# Patient Record
Sex: Female | Born: 1970 | Race: Asian | Hispanic: No | Marital: Married | Smoking: Never smoker
Health system: Southern US, Community
[De-identification: ages and names within clinical notes are randomized; demographics above are authoritative.]

---

## 2009-06-20 ENCOUNTER — Emergency Department (HOSPITAL_COMMUNITY): Admission: EM | Admit: 2009-06-20 | Discharge: 2009-06-20 | Payer: Self-pay | Admitting: Family Medicine

## 2010-07-15 ENCOUNTER — Emergency Department (HOSPITAL_COMMUNITY): Admission: EM | Admit: 2010-07-15 | Discharge: 2010-07-15 | Payer: Self-pay | Admitting: Emergency Medicine

## 2011-03-24 ENCOUNTER — Other Ambulatory Visit: Payer: Self-pay | Admitting: Obstetrics and Gynecology

## 2011-03-24 DIAGNOSIS — Z331 Pregnant state, incidental: Secondary | ICD-10-CM

## 2011-03-24 DIAGNOSIS — O09529 Supervision of elderly multigravida, unspecified trimester: Secondary | ICD-10-CM

## 2011-03-24 DIAGNOSIS — Z3689 Encounter for other specified antenatal screening: Secondary | ICD-10-CM

## 2011-03-24 DIAGNOSIS — O0930 Supervision of pregnancy with insufficient antenatal care, unspecified trimester: Secondary | ICD-10-CM

## 2011-03-24 LAB — HEPATITIS B SURFACE ANTIGEN: Hepatitis B Surface Ag: NEGATIVE

## 2011-03-24 LAB — CBC: Platelets: 234 10*3/uL (ref 150–399)

## 2011-03-24 LAB — RUBELLA ANTIBODY, IGM: Rubella: IMMUNE

## 2011-03-24 LAB — RPR: RPR: NONREACTIVE

## 2011-03-24 LAB — ABO/RH: RH Type: POSITIVE

## 2011-03-25 LAB — TYPE AND SCREEN: Antibody Screen: NEGATIVE

## 2011-03-25 LAB — POCT URINALYSIS DIP (DEVICE)
Bilirubin Urine: NEGATIVE
Glucose, UA: NEGATIVE mg/dL
Hgb urine dipstick: NEGATIVE
Ketones, ur: NEGATIVE mg/dL
Specific Gravity, Urine: 1.015 (ref 1.005–1.030)
Urobilinogen, UA: 0.2 mg/dL (ref 0.0–1.0)

## 2011-03-29 ENCOUNTER — Ambulatory Visit (HOSPITAL_COMMUNITY)
Admission: RE | Admit: 2011-03-29 | Discharge: 2011-03-29 | Disposition: A | Discharge status: Home / Self Care | Payer: Medicaid Other | Source: Ambulatory Visit | Attending: Obstetrics and Gynecology | Admitting: Obstetrics and Gynecology

## 2011-03-29 DIAGNOSIS — Z363 Encounter for antenatal screening for malformations: Secondary | ICD-10-CM | POA: Insufficient documentation

## 2011-03-29 DIAGNOSIS — Z1389 Encounter for screening for other disorder: Secondary | ICD-10-CM | POA: Insufficient documentation

## 2011-03-29 DIAGNOSIS — O09529 Supervision of elderly multigravida, unspecified trimester: Secondary | ICD-10-CM | POA: Insufficient documentation

## 2011-03-29 DIAGNOSIS — O0930 Supervision of pregnancy with insufficient antenatal care, unspecified trimester: Secondary | ICD-10-CM | POA: Insufficient documentation

## 2011-03-29 DIAGNOSIS — O358XX0 Maternal care for other (suspected) fetal abnormality and damage, not applicable or unspecified: Secondary | ICD-10-CM | POA: Insufficient documentation

## 2011-03-29 DIAGNOSIS — Z3689 Encounter for other specified antenatal screening: Secondary | ICD-10-CM

## 2011-04-21 ENCOUNTER — Other Ambulatory Visit: Payer: Self-pay | Admitting: Obstetrics and Gynecology

## 2011-04-21 DIAGNOSIS — O0930 Supervision of pregnancy with insufficient antenatal care, unspecified trimester: Secondary | ICD-10-CM

## 2011-04-21 LAB — POCT URINALYSIS DIP (DEVICE)
Bilirubin Urine: NEGATIVE
Glucose, UA: NEGATIVE mg/dL
Hgb urine dipstick: NEGATIVE
Ketones, ur: NEGATIVE mg/dL
Nitrite: NEGATIVE
Specific Gravity, Urine: 1.015 (ref 1.005–1.030)

## 2011-04-28 ENCOUNTER — Other Ambulatory Visit: Payer: Self-pay | Admitting: Family

## 2011-04-28 ENCOUNTER — Other Ambulatory Visit: Payer: Self-pay | Admitting: Family Medicine

## 2011-04-28 DIAGNOSIS — O09529 Supervision of elderly multigravida, unspecified trimester: Secondary | ICD-10-CM

## 2011-04-28 DIAGNOSIS — O0930 Supervision of pregnancy with insufficient antenatal care, unspecified trimester: Secondary | ICD-10-CM

## 2011-04-28 LAB — POCT URINALYSIS DIP (DEVICE)
Ketones, ur: NEGATIVE mg/dL
Leukocytes, UA: NEGATIVE
Specific Gravity, Urine: 1.015 (ref 1.005–1.030)
pH: 8.5 — ABNORMAL HIGH (ref 5.0–8.0)

## 2011-05-03 ENCOUNTER — Ambulatory Visit (HOSPITAL_COMMUNITY)
Admission: RE | Admit: 2011-05-03 | Discharge: 2011-05-03 | Disposition: A | Discharge status: Home / Self Care | Payer: Medicaid Other | Source: Ambulatory Visit | Attending: Family Medicine | Admitting: Family Medicine

## 2011-05-03 DIAGNOSIS — Z3689 Encounter for other specified antenatal screening: Secondary | ICD-10-CM | POA: Insufficient documentation

## 2011-05-03 DIAGNOSIS — O09529 Supervision of elderly multigravida, unspecified trimester: Secondary | ICD-10-CM | POA: Insufficient documentation

## 2011-05-03 DIAGNOSIS — O365990 Maternal care for other known or suspected poor fetal growth, unspecified trimester, not applicable or unspecified: Secondary | ICD-10-CM | POA: Insufficient documentation

## 2011-05-26 ENCOUNTER — Other Ambulatory Visit: Payer: Self-pay | Admitting: Obstetrics and Gynecology

## 2011-05-26 DIAGNOSIS — O09529 Supervision of elderly multigravida, unspecified trimester: Secondary | ICD-10-CM

## 2011-05-26 DIAGNOSIS — O0930 Supervision of pregnancy with insufficient antenatal care, unspecified trimester: Secondary | ICD-10-CM

## 2011-05-28 LAB — STREP B DNA PROBE: GBSP: NEGATIVE

## 2011-06-08 ENCOUNTER — Inpatient Hospital Stay (HOSPITAL_COMMUNITY)
Admission: AD | Admit: 2011-06-08 | Discharge: 2011-06-11 | DRG: 767 | Disposition: A | Discharge status: Home / Self Care | Payer: Medicaid Other | Source: Ambulatory Visit | Attending: Obstetrics & Gynecology | Admitting: Obstetrics & Gynecology

## 2011-06-08 ENCOUNTER — Encounter (HOSPITAL_COMMUNITY): Payer: Self-pay | Admitting: *Deleted

## 2011-06-08 DIAGNOSIS — O09529 Supervision of elderly multigravida, unspecified trimester: Principal | ICD-10-CM | POA: Diagnosis present

## 2011-06-08 DIAGNOSIS — Z302 Encounter for sterilization: Secondary | ICD-10-CM

## 2011-06-08 LAB — RPR: RPR Ser Ql: NONREACTIVE

## 2011-06-08 LAB — CBC
Hemoglobin: 12.4 g/dL (ref 12.0–15.0)
MCH: 27.6 pg (ref 26.0–34.0)
MCHC: 33.6 g/dL (ref 30.0–36.0)
Platelets: 203 10*3/uL (ref 150–400)
RDW: 13 % (ref 11.5–15.5)

## 2011-06-08 MED ORDER — ACETAMINOPHEN 325 MG PO TABS: 650.0000 mg | ORAL_TABLET | ORAL | Status: DC | PRN

## 2011-06-08 MED ORDER — BENZOCAINE-MENTHOL 20-0.5 % EX AERO: 1.0000 "application " | INHALATION_SPRAY | CUTANEOUS | Status: DC | PRN

## 2011-06-08 MED ORDER — DIPHENHYDRAMINE HCL 25 MG PO CAPS: 25.0000 mg | ORAL_CAPSULE | Freq: Four times a day (QID) | ORAL | Status: DC | PRN

## 2011-06-08 MED ORDER — IBUPROFEN 600 MG PO TABS
600.0000 mg | ORAL_TABLET | Freq: Four times a day (QID) | ORAL | Status: DC | PRN
  Administered 2011-06-08: 600 mg via ORAL
  Filled 2011-06-08: qty 1

## 2011-06-08 MED ORDER — TETANUS-DIPHTH-ACELL PERTUSSIS 5-2.5-18.5 LF-MCG/0.5 IM SUSP
0.5000 mL | Freq: Once | INTRAMUSCULAR | Status: AC
  Administered 2011-06-09: 0.5 mL via INTRAMUSCULAR
  Filled 2011-06-08: qty 0.5

## 2011-06-08 MED ORDER — NALBUPHINE SYRINGE 5 MG/0.5 ML
10.0000 mg | INJECTION | INTRAMUSCULAR | Status: DC | PRN
  Administered 2011-06-08: 10 mg via INTRAVENOUS
  Filled 2011-06-08 (×2): qty 1

## 2011-06-08 MED ORDER — FLEET ENEMA 7-19 GM/118ML RE ENEM: 1.0000 | ENEMA | RECTAL | Status: DC | PRN

## 2011-06-08 MED ORDER — ONDANSETRON HCL 4 MG PO TABS: 4.0000 mg | ORAL_TABLET | ORAL | Status: DC | PRN

## 2011-06-08 MED ORDER — OXYTOCIN 20 UNITS IN LACTATED RINGERS INFUSION - SIMPLE
125.0000 mL/h | Freq: Once | INTRAVENOUS | Status: AC
  Administered 2011-06-08: 999 mL/h via INTRAVENOUS
  Filled 2011-06-08: qty 1000

## 2011-06-08 MED ORDER — LACTATED RINGERS IV SOLN
INTRAVENOUS | Status: DC
  Administered 2011-06-08: 08:00:00 via INTRAVENOUS

## 2011-06-08 MED ORDER — SENNOSIDES-DOCUSATE SODIUM 8.6-50 MG PO TABS
1.0000 | ORAL_TABLET | Freq: Every day | ORAL | Status: DC
  Administered 2011-06-08 – 2011-06-10 (×3): 1 via ORAL

## 2011-06-08 MED ORDER — OXYCODONE-ACETAMINOPHEN 5-325 MG PO TABS
2.0000 | ORAL_TABLET | ORAL | Status: DC | PRN
  Filled 2011-06-08: qty 1

## 2011-06-08 MED ORDER — OXYCODONE-ACETAMINOPHEN 5-325 MG PO TABS
1.0000 | ORAL_TABLET | ORAL | Status: DC | PRN
  Administered 2011-06-08 – 2011-06-11 (×3): 1 via ORAL
  Filled 2011-06-08 (×2): qty 1

## 2011-06-08 MED ORDER — LIDOCAINE HCL (PF) 1 % IJ SOLN
30.0000 mL | INTRAMUSCULAR | Status: DC | PRN
  Filled 2011-06-08 (×2): qty 30

## 2011-06-08 MED ORDER — IBUPROFEN 600 MG PO TABS
600.0000 mg | ORAL_TABLET | Freq: Four times a day (QID) | ORAL | Status: DC
  Administered 2011-06-08 – 2011-06-11 (×10): 600 mg via ORAL
  Filled 2011-06-08 (×10): qty 1

## 2011-06-08 MED ORDER — LACTATED RINGERS IV SOLN: 500.0000 mL | INTRAVENOUS | Status: DC | PRN

## 2011-06-08 MED ORDER — ONDANSETRON HCL 4 MG/2ML IJ SOLN: 4.0000 mg | Freq: Four times a day (QID) | INTRAMUSCULAR | Status: DC | PRN

## 2011-06-08 MED ORDER — CITRIC ACID-SODIUM CITRATE 334-500 MG/5ML PO SOLN: 30.0000 mL | ORAL | Status: DC | PRN

## 2011-06-08 MED ORDER — SIMETHICONE 80 MG PO CHEW
80.0000 mg | CHEWABLE_TABLET | ORAL | Status: DC | PRN
  Administered 2011-06-10: 80 mg via ORAL

## 2011-06-08 MED ORDER — WITCH HAZEL-GLYCERIN EX PADS: MEDICATED_PAD | CUTANEOUS | Status: DC | PRN

## 2011-06-08 MED ORDER — LANOLIN HYDROUS EX OINT: TOPICAL_OINTMENT | CUTANEOUS | Status: DC | PRN

## 2011-06-08 MED ORDER — ONDANSETRON HCL 4 MG/2ML IJ SOLN: 4.0000 mg | INTRAMUSCULAR | Status: DC | PRN

## 2011-06-08 MED ORDER — PRENATAL PLUS 27-1 MG PO TABS
1.0000 | ORAL_TABLET | Freq: Every day | ORAL | Status: DC
  Administered 2011-06-08 – 2011-06-11 (×2): 1 via ORAL
  Filled 2011-06-08 (×2): qty 1

## 2011-06-08 NOTE — Progress Notes (Signed)
With use of interpretor, I discussed with the patient the plans to proceed with her BTL tomorrow at 2 PM. She is allowed to eat now, will be placed on nothing to eat or drink after midnight tonight. I discussed the procedure, that the tubal will be performed through the umbilicus, and is intended to be permanent. The procedure is >99.9% effective, and if it fails the pregnancy is more likely to be in the tube, so she should seek medical care immediately if she felt she was pregnant. Pt's questions are answered in full with use of the interpretor. Pt wishes to proceed at this time as planned for a tubal ligation tomorrow.

## 2011-06-08 NOTE — Progress Notes (Signed)
Pt comfortable with Nubain for pain. Was able to locate a Banar interpreter for remainder of labor and delivery process. Will cont. Monitor labor progress.

## 2011-06-08 NOTE — Progress Notes (Signed)
NSVD of viable female. Apgars 9/9. Placenta delivered intact. Baby taken to warmer to be evaluated by NICU for particulate meconium. Baby placed skin-to-skin on mother's chest after 10 min. Bonding noted.

## 2011-06-08 NOTE — Progress Notes (Signed)
Kim [place] is a 40 y.o. G5P4004 at [redacted]w[redacted]d by ultrasound admitted for active labor. Has had 10mg  IV nubain. In person interpretor here now.  Subjective:   Objective: BP 113/79  Pulse 92  Temp 98.4 F (36.9 C)  Resp 16  Ht 4\' 10"  (1.473 m)  Wt 123 lb (55.792 kg)  BMI 25.71 kg/m2  LMP 09/19/2010      FHT:  FHR: 140 bpm, variability: moderate,  accelerations:  Present,  decelerations:  Absent UC:   regular, every 2-3 minutes SVE:   Dilation: 9 Effacement (%): 100 Station: -2 Exam by:: Marcha Solders, RN High, ballotable   Labs: Lab Results  Component Value Date   WBC 15.2* 06/08/2011   HGB 12.4 06/08/2011   HCT 36.9 06/08/2011   MCV 82.0 06/08/2011   PLT 203 06/08/2011    Assessment / Plan: Spontaneous labor, progressing normally  Labor: Progressing normally Fetal Wellbeing:  Category II Pain Control:  nubain I/D:  n/a Anticipated MOD:  NSVD  Clois Treanor N 06/08/2011, 10:31 AM

## 2011-06-08 NOTE — H&P (Signed)
Kim [place] is a 40 y.o. female presenting for labor. Maternal Medical History:  Reason for admission: Reason for admission: contractions.  Contractions: Onset was 3-5 hours ago.   Frequency: regular.   Perceived severity is moderate.    Fetal activity: Perceived fetal activity is normal.   Last perceived fetal movement was within the past hour.    Prenatal complications: Bleeding.     OB History    Grav Para Term Preterm Abortions TAB SAB Ect Mult Living   5 4 4       4      No past medical history on file. No past surgical history on file. Family History: family history is not on file. Social History:  does not have a smoking history on file. She does not have any smokeless tobacco history on file. Her alcohol and drug histories not on file.  Review of Systems  Constitutional: Negative.   HENT: Negative.   Eyes: Negative.   Cardiovascular: Negative.   Gastrointestinal: Negative.   Genitourinary: Negative.   Musculoskeletal: Negative.   Skin: Negative.   Neurological: Negative.   Endo/Heme/Allergies: Negative.   Psychiatric/Behavioral: Negative.     Dilation: 4.5 Effacement (%): 100 Station: -3 Blood pressure 114/73, pulse 87, temperature 98.3 F (36.8 C), resp. rate 20, height 4\' 10"  (1.473 m), weight 55.792 kg (123 lb), last menstrual period 09/19/2010. Maternal Exam:  Uterine Assessment: Contraction strength is moderate.  Contraction duration is 60 seconds. Contraction frequency is regular.   Abdomen: Patient reports no abdominal tenderness. Estimated fetal weight is 7lb.   Fetal presentation: vertex  Introitus: Normal vulva. Normal vagina.    Physical Exam  Constitutional: She is oriented to person, place, and time. She appears well-developed and well-nourished.  HENT:  Head: Normocephalic.  Cardiovascular: Normal rate, regular rhythm and normal heart sounds.   Respiratory: Effort normal and breath sounds normal.  GI: Soft.  Genitourinary: Vagina normal.    Musculoskeletal: Normal range of motion.  Neurological: She is alert and oriented to person, place, and time. She has normal reflexes.  Skin: Skin is warm and dry.  Psychiatric: She has a normal mood and affect. Her behavior is normal. Judgment normal.    Prenatal labs: ABO, Rh:   Antibody: Negative (05/10 0728) Rubella:   RPR: Nonreactive (05/09 0728)  HBsAg: Negative (05/09 0728)  HIV: Non-reactive (05/09 0728)  GBS: NEGATIVE (07/11 1257)   Assessment/Plan: Admit and anticipate vag del   Zerita Boers 06/08/2011, 7:46 AM

## 2011-06-08 NOTE — Consult Note (Signed)
[facility]  Delivery Note:  Vaginal Birth        06/08/2011  11:30 AM  I was called to Labor and Delivery at request of the patient's obstetrician (Dr. Penne Lash, Faculty Service) for MSF.   PRENATAL HX:   No complications noted.  INTRAPARTUM HX:   Spontaneous labor.  Labor complicated by MSF.  DELIVERY:   SVD.  Baby vigorous.  Bulb suctioned by OB.  We bulb suctioned mouth and nose, with small amount of MSF.  No respiratory distress.  Apgars 9 and 9.   _______________________________________________________________________ Electronically Signed By: Angelita Ingles, MD Neonatologist

## 2011-06-08 NOTE — Progress Notes (Signed)
Pt transferred to [facility] Room 115 via w/c in stable condition with baby in crib and husband carrying belongings. Report given to [facility] RN to include the request by Dr. Jolayne Panther to talk with the patient once the interpreter returns re: the PPBLT scheduled for 06/09/11 in the am.

## 2011-06-08 NOTE — Progress Notes (Signed)
Pt is a G5P4 here in active labor. Pt speaks Banar and her niece is here as an interpreter for the patient. Introduce self to patient, husband, and niece. Attempted to contact interpreter through the [facility] Interpretation Line who speaks Banar but unsuccessful. Pt is uncomfortable with pain from the contractions. Requesting something for pain. Pain options explained to patient and husband through niece. Armband verified. CHG hygiene completed in MAU. Unable to initiate safety video due to language barrier. Plan of care reviewed. Questions answered through niece. Pt verbalized understanding of plan through niece as interpreter.

## 2011-06-08 NOTE — Progress Notes (Signed)
UR Chart review completed.  

## 2011-06-09 MED ORDER — FAMOTIDINE 20 MG PO TABS
40.0000 mg | ORAL_TABLET | Freq: Once | ORAL | Status: AC
  Administered 2011-06-09: 40 mg via ORAL
  Filled 2011-06-09: qty 2

## 2011-06-09 MED ORDER — LACTATED RINGERS IV SOLN
INTRAVENOUS | Status: DC
  Administered 2011-06-09: 20 mL/h via INTRAVENOUS

## 2011-06-09 MED ORDER — MIDAZOLAM HCL 2 MG/2ML IJ SOLN
INTRAMUSCULAR | Status: AC
  Filled 2011-06-09: qty 2

## 2011-06-09 MED ORDER — METOCLOPRAMIDE HCL 10 MG PO TABS
10.0000 mg | ORAL_TABLET | Freq: Once | ORAL | Status: AC
  Administered 2011-06-09: 10 mg via ORAL
  Filled 2011-06-09: qty 1

## 2011-06-09 MED ORDER — FENTANYL CITRATE 0.05 MG/ML IJ SOLN
INTRAMUSCULAR | Status: AC
  Filled 2011-06-09: qty 2

## 2011-06-09 MED ORDER — BUPIVACAINE HCL (PF) 0.25 % IJ SOLN
INTRAMUSCULAR | Status: DC | PRN
  Administered 2011-06-10: 10 mL

## 2011-06-09 NOTE — Progress Notes (Signed)
  Post Partum Day 1 Subjective: Pt's husband was in the room, who understands some english. Pt doing well. NPO for BTL. Breastfeeding and bottle feeding. Pain is controled. Voiding.   Objective: Blood pressure 117/76, pulse 73, temperature 97.7 F (36.5 C), temperature source Oral, resp. rate 16, height 4\' 10"  (1.473 m), weight 123 lb (55.792 kg), last menstrual period 09/19/2010, SpO2 98.00%.  Physical Exam:  General: alert, cooperative and no distress Lochia: appropriate Uterine Fundus: firm, 1cm below umbilicus DVT Evaluation: No evidence of DVT seen on physical exam. Negative Homan's sign.   Basename 06/08/11 0730  HGB 12.4  HCT 36.9    Assessment/Plan:  Pt doing well.  BTL today at 2pm. Interpreter for [employer] called to be at procedure. Continue breastfeeding and bottle feeding Continue current postpartum management.   Plan for discharge tomorrow   LOS: 1 day   Tama Grosz 06/09/2011, 7:44 AM

## 2011-06-09 NOTE — Anesthesia Preprocedure Evaluation (Addendum)
Anesthesia Evaluation  Name, MR# and DOB Patient awake  General Assessment Comment  Reviewed: Allergy & Precautions, H&P , Patient's Chart, lab work & pertinent test results and reviewed documented beta blocker date and time   History of Anesthesia Complications Negative for: history of anesthetic complications  Airway Mallampati: I TM Distance: >3 FB Neck ROM: full    Dental  (+) Teeth Intact   Pulmonaryneg pulmonary ROS    clear to auscultation    Cardiovascular regular Normal   Neuro/PsychNegative Neurological ROS Negative Psych ROS  GI/Hepatic/Renal negative GI ROS, negative Liver ROS, and negative Renal ROS (+)       Endo/Other  Negative Endocrine ROS (+)   Abdominal   Musculoskeletal  Hematology negative hematology ROS (+)   Peds  Reproductive/Obstetrics (+) Pregnancy (2 days s/p SVD)   Anesthesia Other Findings             Anesthesia Physical Anesthesia Plan  ASA: II  Anesthesia Plan: Spinal   Post-op Pain Management:    Induction:   Airway Management Planned:   Additional Equipment:   Intra-op Plan:   Post-operative Plan:   Informed Consent: I have reviewed the patients History and Physical, chart, labs and discussed the procedure including the risks, benefits and alternatives for the proposed anesthesia with the patient or authorized representative who has indicated his/her understanding and acceptance.   Dental Advisory Given  Plan Discussed with: CRNA  Anesthesia Plan Comments: (Lab work confirmed with CRNA in room. Platelets okay. Discussed spinal anesthetic,using the language line, and patient consents to the procedure:  included risk of possible headache,backache, failed block, allergic reaction, and nerve injury. This patient was asked if she had any questions or concerns before the procedure started.  )        Anesthesia Quick Evaluation

## 2011-06-10 ENCOUNTER — Inpatient Hospital Stay (HOSPITAL_COMMUNITY): Payer: Medicaid Other | Admitting: Anesthesiology

## 2011-06-10 ENCOUNTER — Encounter (HOSPITAL_COMMUNITY): Payer: Self-pay | Admitting: Anesthesiology

## 2011-06-10 ENCOUNTER — Encounter (HOSPITAL_COMMUNITY)
Admission: AD | Disposition: A | Discharge status: Home / Self Care | Payer: Self-pay | Source: Ambulatory Visit | Attending: Obstetrics & Gynecology

## 2011-06-10 ENCOUNTER — Encounter (HOSPITAL_COMMUNITY): Payer: Self-pay | Admitting: *Deleted

## 2011-06-10 DIAGNOSIS — Z302 Encounter for sterilization: Secondary | ICD-10-CM

## 2011-06-10 HISTORY — PX: TUBAL LIGATION: SHX77

## 2011-06-10 SURGERY — LIGATION, FALLOPIAN TUBE, POSTPARTUM
Laterality: Bilateral | Wound class: Clean

## 2011-06-10 MED ORDER — KETOROLAC TROMETHAMINE 30 MG/ML IJ SOLN
INTRAMUSCULAR | Status: AC
  Filled 2011-06-10: qty 1

## 2011-06-10 MED ORDER — DEXAMETHASONE SODIUM PHOSPHATE 10 MG/ML IJ SOLN
INTRAMUSCULAR | Status: DC | PRN
  Administered 2011-06-10: 10 mg via INTRAVENOUS

## 2011-06-10 MED ORDER — DEXAMETHASONE SODIUM PHOSPHATE 10 MG/ML IJ SOLN
INTRAMUSCULAR | Status: AC
  Filled 2011-06-10: qty 1

## 2011-06-10 MED ORDER — MIDAZOLAM HCL 5 MG/5ML IJ SOLN
INTRAMUSCULAR | Status: DC | PRN
  Administered 2011-06-10: 2 mg via INTRAVENOUS

## 2011-06-10 MED ORDER — MIDAZOLAM HCL 2 MG/2ML IJ SOLN
INTRAMUSCULAR | Status: AC
  Filled 2011-06-10: qty 2

## 2011-06-10 MED ORDER — FENTANYL CITRATE 0.05 MG/ML IJ SOLN
INTRAMUSCULAR | Status: AC
  Filled 2011-06-10: qty 2

## 2011-06-10 MED ORDER — KETOROLAC TROMETHAMINE 30 MG/ML IJ SOLN
INTRAMUSCULAR | Status: DC | PRN
  Administered 2011-06-10: 30 mg via INTRAVENOUS

## 2011-06-10 MED ORDER — ONDANSETRON HCL 4 MG/2ML IJ SOLN
INTRAMUSCULAR | Status: AC
  Filled 2011-06-10: qty 2

## 2011-06-10 MED ORDER — LACTATED RINGERS IV SOLN
INTRAVENOUS | Status: DC
  Administered 2011-06-10 (×2): via INTRAVENOUS
  Administered 2011-06-10: 20 mL/h via INTRAVENOUS

## 2011-06-10 MED ORDER — FAMOTIDINE 20 MG PO TABS
40.0000 mg | ORAL_TABLET | Freq: Once | ORAL | Status: AC
  Administered 2011-06-10: 40 mg via ORAL
  Filled 2011-06-10: qty 2

## 2011-06-10 MED ORDER — BUPIVACAINE HCL 0.75 % IJ SOLN
INTRAMUSCULAR | Status: DC | PRN
  Administered 2011-06-10: 7.5 mg via INTRATHECAL

## 2011-06-10 MED ORDER — FENTANYL CITRATE 0.05 MG/ML IJ SOLN
INTRAMUSCULAR | Status: DC | PRN
  Administered 2011-06-10: 100 ug via INTRAVENOUS

## 2011-06-10 MED ORDER — METOCLOPRAMIDE HCL 10 MG PO TABS
10.0000 mg | ORAL_TABLET | Freq: Once | ORAL | Status: AC
  Administered 2011-06-10: 10 mg via ORAL
  Filled 2011-06-10: qty 1

## 2011-06-10 MED ORDER — ONDANSETRON HCL 4 MG/2ML IJ SOLN
INTRAMUSCULAR | Status: DC | PRN
  Administered 2011-06-10: 4 mg via INTRAVENOUS

## 2011-06-10 SURGICAL SUPPLY — 23 items
CHLORAPREP W/TINT 26ML (MISCELLANEOUS) ×2 IMPLANT
CLIP FILSHIE TUBAL LIGA STRL (Clip) ×2 IMPLANT
CONTAINER PREFILL 10% NBF 15ML (MISCELLANEOUS) ×4 IMPLANT
DRAPE UTILITY XL STRL (DRAPES) ×2 IMPLANT
ELECT REM PT RETURN 9FT ADLT (ELECTROSURGICAL) ×2
ELECTRODE REM PT RTRN 9FT ADLT (ELECTROSURGICAL) ×1 IMPLANT
GLOVE BIOGEL PI IND STRL 6.5 (GLOVE) ×2 IMPLANT
GLOVE BIOGEL PI INDICATOR 6.5 (GLOVE) ×2
GLOVE SURG SS PI 6.0 STRL IVOR (GLOVE) ×2 IMPLANT
GOWN PREVENTION PLUS LG XLONG (DISPOSABLE) ×4 IMPLANT
NEEDLE HYPO 25X1 1.5 SAFETY (NEEDLE) IMPLANT
NS IRRIG 1000ML POUR BTL (IV SOLUTION) ×2 IMPLANT
PACK ABDOMINAL MINOR (CUSTOM PROCEDURE TRAY) ×2 IMPLANT
PENCIL BUTTON HOLSTER BLD 10FT (ELECTRODE) ×2 IMPLANT
SPONGE LAP 4X18 X RAY DECT (DISPOSABLE) IMPLANT
SUT PLAIN 0 NONE (SUTURE) ×2 IMPLANT
SUT VIC AB 0 CT1 27 (SUTURE) ×1
SUT VIC AB 0 CT1 27XBRD ANBCTR (SUTURE) ×1 IMPLANT
SUT VIC AB 3-0 PS2 18 (SUTURE) ×2 IMPLANT
SYR CONTROL 10ML LL (SYRINGE) IMPLANT
TOWEL OR 17X24 6PK STRL BLUE (TOWEL DISPOSABLE) ×4 IMPLANT
TRAY FOLEY CATH 14FR (SET/KITS/TRAYS/PACK) ×2 IMPLANT
WATER STERILE IRR 1000ML POUR (IV SOLUTION) ×2 IMPLANT

## 2011-06-10 NOTE — Progress Notes (Signed)
Spoke with patient at length with the interpreter and family at bedside.  Pt consented for BTL.  Risks/benefits/alternatives discussed with the patient including but not limited to risk of surgery of bleeding, pain, infection, permanency, internal organ injury, need for additional procedures, risk of regret of 0.25%, risk of ectopic pregnancy, and risk of failure.  Pt agrees to proceed with BTL for contraception.

## 2011-06-10 NOTE — Progress Notes (Signed)
Per RN mom is only bottlefeeding at this time.

## 2011-06-10 NOTE — Procedures (Signed)
Kim [place] 06/10/2011  PREOPERATIVE DIAGNOSIS:  Undesired fertility  POSTOPERATIVE DIAGNOSIS:  Undesired fertility  PROCEDURE:  Postpartum Bilateral Tubal Sterilization using Filshie Clips   ANESTHESIA:  Spinal  COMPLICATIONS:  None immediate.  ESTIMATED BLOOD LOSS:  Less than 20cc.  FLUIDS: 1100 cc LR.  URINE OUTPUT:  100 cc of clear urine.  INDICATIONS: 40 y.o. yo [id number]  with undesired fertility,status post vaginal delivery, desires permanent sterilization. Risks and benefits of procedure discussed with patient including permanence of method, bleeding, infection, injury to surrounding organs and need for additional procedures. Risk failure of 0.5-1% with increased risk of ectopic gestation if pregnancy occurs was also discussed with patient. Preoperative counseling done with the help of a [place] interpreter.   FINDINGS:  Normal uterus, tubes, and ovaries.  TECHNIQUE:  The patient was taken to the operating room where spinal anesthesia was administered and found to be adequate.  She was then placed in the dorsal supine position and prepped and draped in sterile fashion.  After an adequate timeout was performed, attention was turned to the patient's abdomen where a small transverse skin incision was made under the umbilical fold. The incision was taken down to the layer of fascia using the scalpel, and fascia was incised, and extended bilaterally using Mayo scissors. The peritoneum was entered in a sharp fashion. Attention was then turned to the patient's uterus, and left fallopian tube was identified and followed out to the fimbriated end.  A Filshie clip was placed on the left fallopian tube about 2 cm from the cornual attachment, with care given to incorporate the underlying mesosalpinx.  A similar process was carried out on the rightl side allowing for bilateral tubal sterilization.  Good hemostasis was noted overall.  Local analgesia was drizzled on both operative sites.The  instruments were then removed from the patient's abdomen and the fascial incision was repaired with 0 Vicryl, and the skin was closed with a 3-0 Monocryl subcuticular stitch. The patient tolerated the procedure well.  Sponge, lap, and needle counts were correct times two.  The patient was then taken to the recovery room awake, extubated and in stable condition.  Nazarene Bunning A 06/10/2011 3:02 PM

## 2011-06-10 NOTE — Progress Notes (Signed)
Post Partum Day 2 Subjective: no complaints, tolerating PO, + flatus and denies BM, normal lochia, and ?BTL today for pp contraception.   Objective: Blood pressure 103/62, pulse 58, temperature 97.5 F (36.4 C), temperature source Oral, resp. rate 16, height 4\' 10"  (1.473 m), weight 55.792 kg (123 lb), last menstrual period 09/19/2010, SpO2 98.00%.  Physical Exam:  General: alert and cooperative Lochia: appropriate Uterine Fundus: firm Chest: CTAB Heart: RRR no m/r/g Abd: +BS, soft, NT DVT Evaluation: No evidence of DVT seen on physical exam.   Basename 06/08/11 0730  HGB 12.4  HCT 36.9    Assessment/Plan: Contraception BTL scheduled today.  will speak with the patient with interpreter.    LOS: 2 days   Amarri Satterly 06/10/2011, 8:38 AM

## 2011-06-10 NOTE — Anesthesia Procedure Notes (Signed)
Spinal Block  Patient location during procedure: OR Start time: 06/10/2011 2:25 PM Staffing Anesthesiologist: CASSIDY, AMY L. Performed by: anesthesiologist  Preanesthetic Checklist Completed: patient identified, site marked, surgical consent, pre-op evaluation, timeout performed, IV checked, risks and benefits discussed and monitors and equipment checked Spinal Block Patient position: sitting Prep: DuraPrep Patient monitoring: heart rate, cardiac monitor, continuous pulse ox and blood pressure Approach: midline Location: L3-4 Injection technique: single-shot Needle Needle type: Sprotte  Needle gauge: 24 G Needle length: 5 cm Assessment Sensory level: T4

## 2011-06-10 NOTE — Transfer of Care (Signed)
Immediate Anesthesia Transfer of Care Note  Patient: Kim [place]  Procedure(s) Performed:  POST PARTUM TUBAL LIGATION  Patient Location: PACU  Anesthesia Type: Spinal  Level of Consciousness: awake, alert  and oriented  Airway & Oxygen Therapy: Patient Spontanous Breathing  Post-op Assessment: Report given to PACU RN and Post -op Vital signs reviewed and stable  Post vital signs: Reviewed and stable  Complications: No apparent anesthesia complications

## 2011-06-10 NOTE — Anesthesia Postprocedure Evaluation (Signed)
Vital signs stable Patient alert Pain and nausea are controlled No apparent anesthetic complications No follow up care needed Pt may be d/c when legs are bending 

## 2011-06-10 NOTE — Progress Notes (Signed)
Used intrepreter in ss

## 2011-06-11 MED ORDER — IBUPROFEN 600 MG PO TABS: 600.0000 mg | ORAL_TABLET | Freq: Four times a day (QID) | ORAL | Status: AC

## 2011-06-11 NOTE — Discharge Summary (Signed)
Obstetric Discharge Summary Reason for Admission: onset of labor Prenatal Procedures: ultrasound Intrapartum Procedures: spontaneous vaginal delivery and tubal ligation Postpartum Procedures: P.P. tubal ligation Complications-Operative and Postpartum: none  Hemoglobin  Date Value Range Status  06/08/2011 12.4  12.0-15.0 (g/dL) Final     HCT  Date Value Range Status  06/08/2011 36.9  36.0-46.0 (%) Final    Discharge Diagnoses: Term Pregnancy-delivered  Discharge Information: Date: 06/11/2011 Activity: pelvic rest Diet: routine Medications: Ibuprophen Condition: stable Instructions: Given via interpreter Discharge to: home Follow-up Information    Follow up with [facility] [place] in 6 weeks.   Contact information:   [address] [facility] [place] [address]          Newborn Data: Live born  Information for the patient's newborn:  Sarayu, Prevost [[id number]]  female ; APGAR , ; weight ;  Home with mother.  [facility] 06/11/2011, 11:06 AM

## 2011-06-11 NOTE — Progress Notes (Signed)
Post Partum Day 3/POD#1 S/P NSVD and BTL Subjective: no complaints, up ad lib, voiding and tolerating PO; +flatus. No problems or concerns. Patient states doing well. Reports minimal bleeding and pain controlled with pain medications. Denies calf pain.  Bottlefeeding/breastfeeding.   Objective: Blood pressure 122/79, pulse 3, temperature 98 F (36.7 C), temperature source Oral, resp. rate 20, height 4\' 10"  (1.473 m), weight 55.792 kg (123 lb), last menstrual period 09/19/2010, SpO2 96.00%.  Physical Exam:  General: alert, cooperative, appears older than stated age and no distress Lochia: appropriate Uterine Fundus: firm, 2 below umbilicus Incision: healing well, no significant drainage; dressing removed and 2 steri strips placed DVT Evaluation: No evidence of DVT seen on physical exam. Negative Homan's sign.  Assessment/Plan: Discharge home Postpartum instructions given via interpreter Pt instructed to report increased incision site drainage; redness, fever, increased vaginal bleeding. Pt to follow-up at health department in 6 weeks.     LOS: 3 days   [facility] 06/11/2011, 10:59 AM

## 2011-06-25 ENCOUNTER — Encounter (HOSPITAL_COMMUNITY): Payer: Self-pay | Admitting: *Deleted

## 2011-07-01 ENCOUNTER — Encounter (HOSPITAL_COMMUNITY): Payer: Self-pay | Admitting: Obstetrics & Gynecology

## 2011-07-14 ENCOUNTER — Encounter: Payer: Self-pay | Admitting: Advanced Practice Midwife

## 2011-07-14 ENCOUNTER — Ambulatory Visit (INDEPENDENT_AMBULATORY_CARE_PROVIDER_SITE_OTHER): Payer: Medicaid Other | Admitting: Advanced Practice Midwife

## 2011-07-14 MED ORDER — IBUPROFEN 200 MG PO TABS: 600.0000 mg | ORAL_TABLET | Freq: Four times a day (QID) | ORAL | Status: AC | PRN

## 2011-07-14 NOTE — Patient Instructions (Signed)
  Place postpartum visit patient instructions here.  

## 2011-07-14 NOTE — Progress Notes (Signed)
  Subjective:     Kim [place] is a 40 y.o. female who presents for a postpartum visit. She is 6 weeks postpartum following a spontaneous vaginal delivery. I have fully reviewed the prenatal and intrapartum course. The delivery was at 37.2 gestational weeks. Outcome: spontaneous vaginal delivery. Anesthesia: epidural. Postpartum course has been uneventful. Baby's course has been uneventful. Baby is feeding by both breast and bottle - n/a. Bleeding no bleeding. Bowel function is normal. Bladder function is normal. Patient is not sexually active. Contraception method is tubal ligation. Postpartum depression screening: negative.  Does report some Right side and back pain for past 2 days with lifting.  The following portions of the patient's history were reviewed and updated as appropriate: allergies, current medications, past family history, past medical history, past social history, past surgical history and problem list.  Review of Systems Pertinent items are noted in HPI.   Objective:    BP 123/92  Pulse 82  Temp(Src) 96.6 F (35.9 C) (Oral)  Ht 5\' 3"  (1.6 m)  Wt 109 lb 1.6 oz (49.487 kg)  BMI 19.33 kg/m2  Breastfeeding? Yes  General:  alert, cooperative and appears stated age   Breasts:  inspection negative, no nipple discharge or bleeding, no masses or nodularity palpable  Lungs: not examined  Heart:  not examined  Abdomen: soft, non-tender; bowel sounds normal; no masses,  no organomegaly   Vulva:  normal  Vagina: normal vagina  Cervix:  anteverted and no cervical motion tenderness  Corpus: normal  Adnexa:  normal adnexa  Rectal Exam: Not performed.        Assessment:    Normal  postpartum exam.   Recent lumbar strain Pap done in May 2012 S/P SVD and BTL Persistent mild hypertension  Plan:    1. Contraception: tubal ligation 2. Reviewed heat therapy and ibuprofen for strain. 3. Follow up in: 9 months or as needed.  4.  Rx ibuprofen 5.  Refer to primary doctor for  hypertension surveillance

## 2011-08-12 ENCOUNTER — Other Ambulatory Visit: Payer: Medicaid Other

## 2012-02-03 IMAGING — US US OB FOLLOW-UP
1 series · 12 of 28 positions shown · non-contrast
Comparison: none

[Series 1: us ob follow up · 12 of 38 slices shown]
[im 2/38]
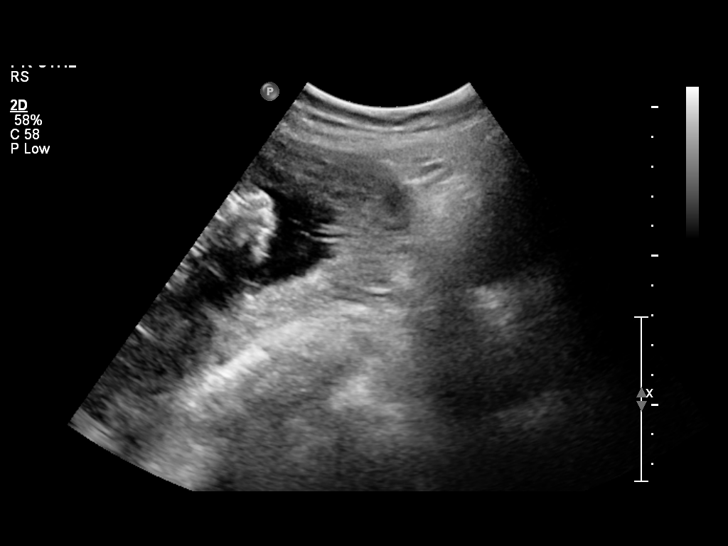
[im 5/38]
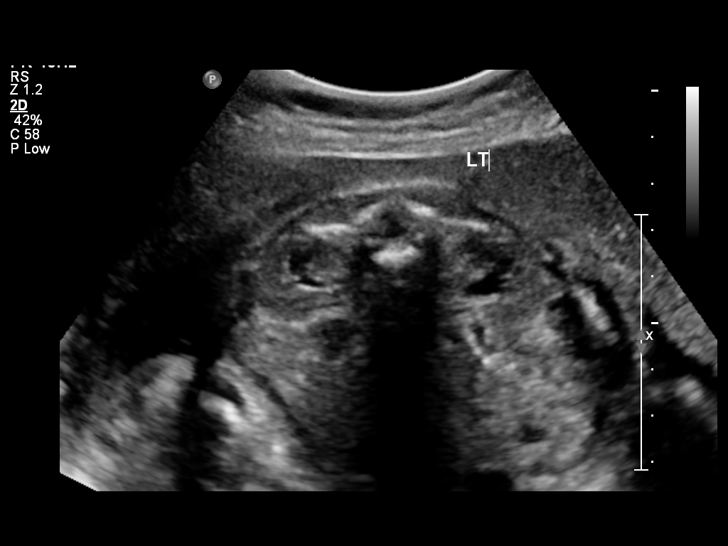
[im 7/38]
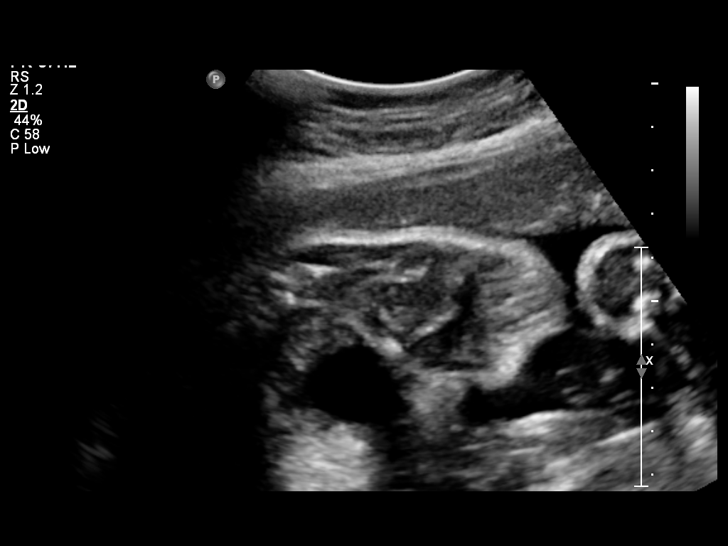
[im 11/38]
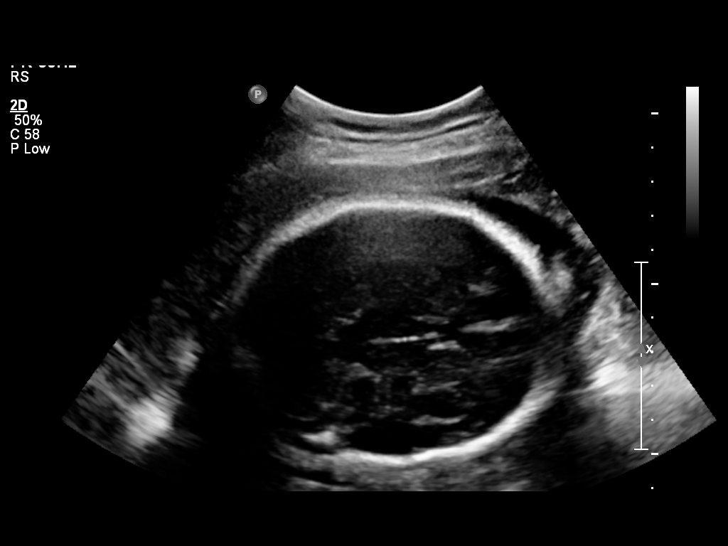
[im 14/38]
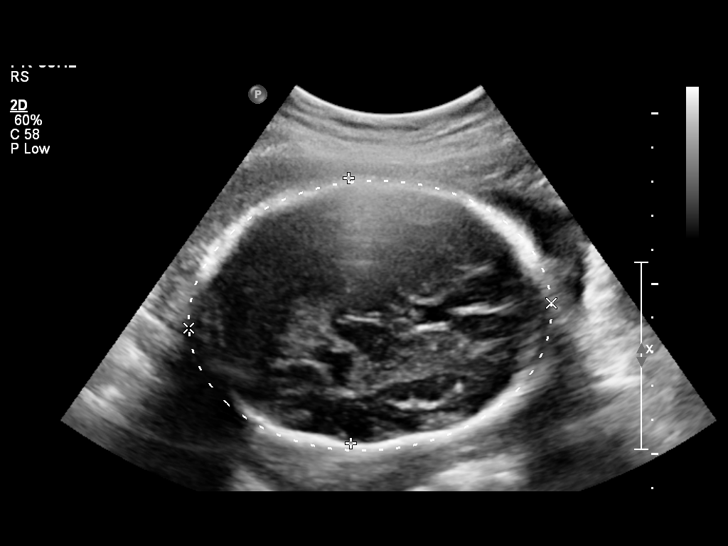
[im 17/38]
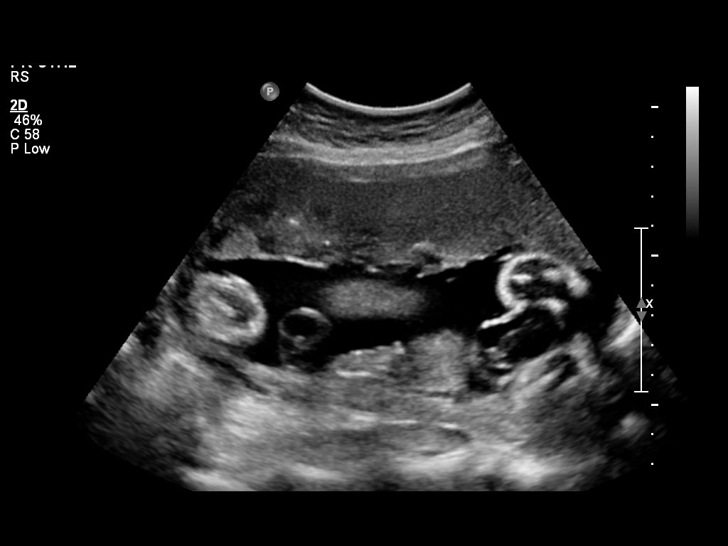
[im 21/38]
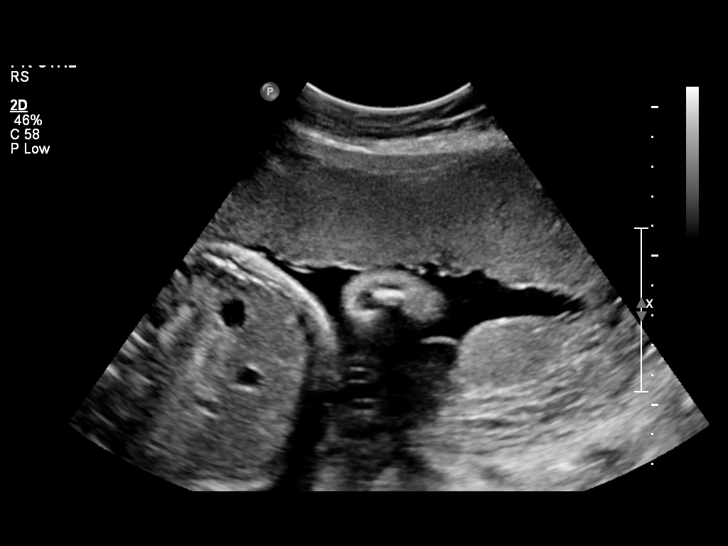
[im 24/38]
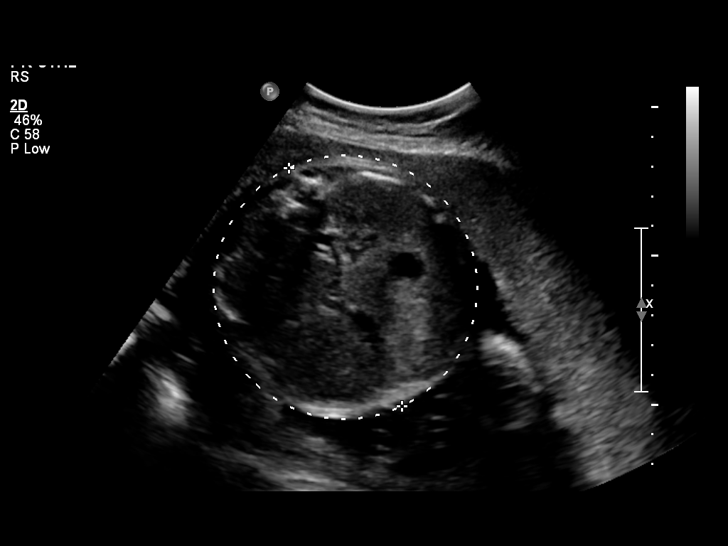
[im 27/38]
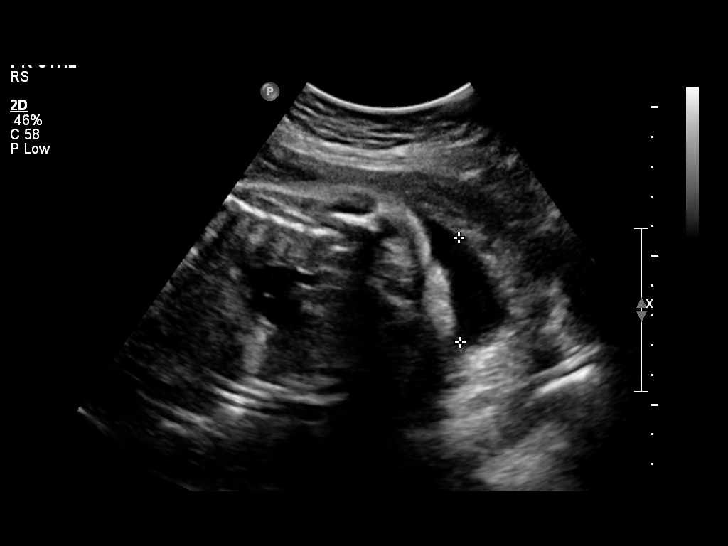
[im 31/38]
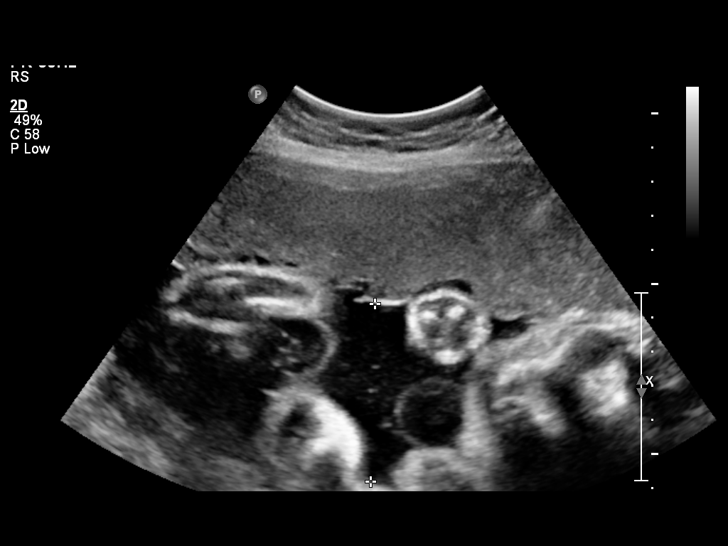
[im 33/38]
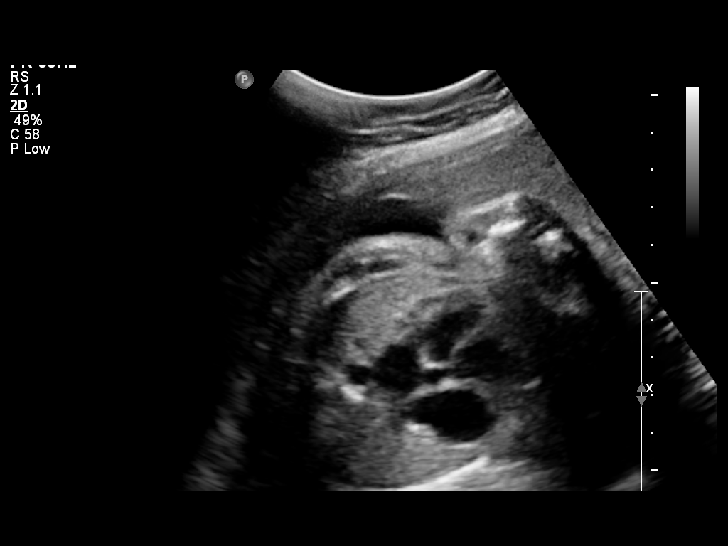
[im 36/38]
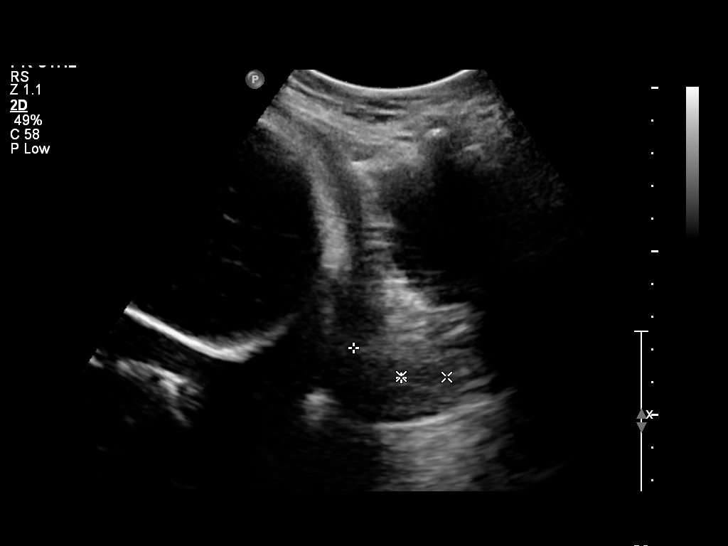

[12 of 28 positions shown; findings below may reference images not displayed]

OBSTETRICS REPORT
                      (Signed Final 05/03/2011 [DATE])

 Order#:         [id number]
Procedures

 US OB FOLLOW UP                                       76816.1
Indications

 Advanced maternal age (AMA), Multigravida
 Size less than dates (Small for gestational [AGE]
 FGR)
Fetal Evaluation

 Fetal Heart Rate:  142                          bpm
 Cardiac Activity:  Observed
 Presentation:      Cephalic
 Placenta:          Anterior, above cervical os
 P. Cord            Previously Visualized
 Insertion:

 Amniotic Fluid
 AFI FV:      Subjectively within normal limits
 AFI Sum:     14.65   cm       51  %Tile     Larg Pckt:    5.19  cm
 RUQ:   2.93    cm   RLQ:    5.19   cm    LUQ:   3.03    cm   LLQ:    3.5    cm
Biometry

 BPD:     77.5  mm     G. Age:  31w 1d                CI:        72.24   70 - 86
                                                      FL/HC:      20.5   19.1 -

 HC:     290.1  mm     G. Age:  31w 6d       12  %    HC/AC:      1.06   0.96 -

 AC:     274.4  mm     G. Age:  31w 4d       31  %    FL/BPD:     76.9   71 - 87
 FL:      59.6  mm     G. Age:  31w 0d       13  %    FL/AC:      21.7   20 - 24

 Est. FW:    5133  gm    3 lb 14 oz      42  %
Gestational Age

 U/S Today:     31w 3d                                        EDD:   07/02/11
 Best:          32w 1d     Det. By:  U/S (03/29/11)           EDD:   06/27/11
Anatomy
 Cranium:           Appears normal      Aortic Arch:       Previously seen
 Fetal Cavum:       Appears normal      Ductal Arch:       Previously seen
 Ventricles:        Appears normal      Diaphragm:         Appears normal
 Choroid Plexus:    Appears normal      Stomach:           Appears normal
 Cerebellum:        Previously seen     Abdomen:           Appears normal
 Posterior Fossa:   Previously seen     Abdominal Wall:    Previously seen
 Nuchal Fold:       Previously seen     Cord Vessels:      Previously seen
 Face:              Previously seen     Kidneys:           Appear normal
 Heart:             Appears normal      Bladder:           Appears normal
                    (4 chamber &
                    axis)
 RVOT:              Previously seen     Spine:             Previously seen
 LVOT:              Previously seen     Limbs:             Previously seen

 Other:     Male gender,Heels,5th digit, and Nasal bone previously
            visualized.
Cervix Uterus Adnexa

 Cervical Length:    3.1      cm

 Cervix:       Closed. Normal appearance by transabdominal scan.

 Adnexa:     No abnormality visualized.
Impression

 Single intrauterine gestation demonstrating an estimated
 gestational age by ultrasound of 31w 3d. This is correlated
 with expected estimated gestational age by prior ultrasound
 of 32w 1d. EFW is currently at the 42%.

 No late developing fetal anatomic abnormalities are noted
 associated with the lateral ventricles, four chamber heart,
 stomach, kidneys or bladder.

 Subjectively and quantitatively normal amniotic fluid volume.

 Normal cervical length and appearance.

 questions or concerns.

## 2012-05-04 ENCOUNTER — Encounter (INDEPENDENT_AMBULATORY_CARE_PROVIDER_SITE_OTHER): Payer: Medicaid Other | Admitting: Obstetrics & Gynecology

## 2012-05-04 NOTE — Progress Notes (Signed)
error 

## 2012-05-04 NOTE — Progress Notes (Signed)
This encounter was created in error - please disregard.

## 2012-06-05 ENCOUNTER — Ambulatory Visit: Payer: Medicaid Other | Admitting: Advanced Practice Midwife

## 2013-12-31 ENCOUNTER — Encounter (HOSPITAL_COMMUNITY): Payer: Self-pay | Admitting: Emergency Medicine

## 2013-12-31 ENCOUNTER — Emergency Department (HOSPITAL_COMMUNITY)
Admission: EM | Admit: 2013-12-31 | Discharge: 2013-12-31 | Disposition: A | Discharge status: Home / Self Care | Payer: No Typology Code available for payment source | Source: Home / Self Care | Attending: Family Medicine | Admitting: Family Medicine

## 2013-12-31 DIAGNOSIS — G629 Polyneuropathy, unspecified: Secondary | ICD-10-CM

## 2013-12-31 DIAGNOSIS — G589 Mononeuropathy, unspecified: Secondary | ICD-10-CM

## 2013-12-31 LAB — POCT I-STAT, CHEM 8
BUN: 8 mg/dL (ref 6–23)
CHLORIDE: 101 meq/L (ref 96–112)
Calcium, Ion: 1.22 mmol/L (ref 1.12–1.23)
Creatinine, Ser: 0.7 mg/dL (ref 0.50–1.10)
GLUCOSE: 86 mg/dL (ref 70–99)
HEMATOCRIT: 48 % — AB (ref 36.0–46.0)
Hemoglobin: 16.3 g/dL — ABNORMAL HIGH (ref 12.0–15.0)
POTASSIUM: 3.9 meq/L (ref 3.7–5.3)
Sodium: 139 mEq/L (ref 137–147)
TCO2: 27 mmol/L (ref 0–100)

## 2013-12-31 MED ORDER — MELOXICAM 7.5 MG PO TABS: 7.5000 mg | ORAL_TABLET | Freq: Every day | ORAL | Status: DC

## 2013-12-31 NOTE — Discharge Instructions (Signed)

## 2013-12-31 NOTE — ED Provider Notes (Signed)
CSN: [id number]     Arrival date & time 12/31/13  1007 History   First MD Initiated Contact with Patient 12/31/13 1058     Chief Complaint  Patient presents with  . Headache     (Consider location/radiation/quality/duration/timing/severity/associated sxs/prior Treatment) Patient is a 43 y.o. female presenting with headaches. The history is provided by the patient. No language interpreter was used.  Headache Pain location:  L parietal Quality: hot feeling. Radiates to:  Does not radiate Severity currently:  3/10 Severity at highest:  3/10 Timing:  Constant Progression:  Worsening Chronicity:  New Relieved by:  Nothing Worsened by:  Nothing tried Associated symptoms: myalgias and numbness   Pt complains of her head feeling hot and her foot feeling the same way  History reviewed. No pertinent past medical history. Past Surgical History  Procedure Laterality Date  . Tubal ligation  06/10/2011    Procedure: POST PARTUM TUBAL LIGATION;  Surgeon: Tereso NewcomerUgonna A Anyanwu, MD;  Location: [facility];  Service: Gynecology;  Laterality: Bilateral;   History reviewed. No pertinent family history. History  Substance Use Topics  . Smoking status: Never Smoker   . Smokeless tobacco: Never Used  . Alcohol Use: No   OB History   Grav Para Term Preterm Abortions TAB SAB Ect Mult Living   5 5 5       5      Review of Systems  Musculoskeletal: Positive for myalgias.  Neurological: Positive for numbness and headaches.  All other systems reviewed and are negative.      Allergies  Review of patient's allergies indicates no known allergies.  Home Medications   Current Outpatient Rx  Name  Route  Sig  Dispense  Refill  . Prenatal Vit-Fe Fumarate-FA (PRENATAL PLUS) 65-1 MG TABS   Oral   Take 1 tablet by mouth daily.            BP 120/74  Pulse 78  Temp(Src) 99.4 F (37.4 C) (Oral)  Resp 16  SpO2 100%  LMP 12/31/2013 Physical Exam  Nursing note and vitals reviewed. Constitutional:  She is oriented to person, place, and time. She appears well-developed and well-nourished.  HENT:  Head: Normocephalic.  Eyes: EOM are normal.  Neck: Normal range of motion.  Pulmonary/Chest: Effort normal.  Abdominal: She exhibits no distension.  Musculoskeletal: Normal range of motion.  Neurological: She is alert and oriented to person, place, and time.  Psychiatric: She has a normal mood and affect.    ED Course  Procedures (including critical care time) Labs Review Labs Reviewed  POCT I-STAT, CHEM 8 - Abnormal; Notable for the following:    Hemoglobin 16.3 (*)    HCT 48.0 (*)    All other components within normal limits   Imaging Review No results found.    MDM   Final diagnoses:  Neuropathy    I suspect some type of neuropathy.   I will try meloxicam for symptoms.   Glucose normal.   Pt referred to wellness center for complete evaluation    Elson AreasLeslie K Sofia, PA-C 12/31/13 1236

## 2013-12-31 NOTE — ED Notes (Signed)
Pt  Has  Symptoms  Of  Headache    described  As  Pounding   With  The  Symptoms    Not  releived  By otc  meds     Pt has  Had  The  Symptoms  For a  While     Worse  Recently

## 2014-01-01 NOTE — ED Provider Notes (Signed)
Medical screening examination/treatment/procedure(s) were performed by non-physician practitioner and as supervising physician I was immediately available for consultation/collaboration.  Leslee Homeavid Elishia Kaczorowski, M.D.   Reuben Likesavid C Jorden Minchey, MD 01/01/14 1430

## 2014-06-12 ENCOUNTER — Encounter (HOSPITAL_COMMUNITY): Payer: Self-pay | Admitting: Emergency Medicine

## 2014-06-12 ENCOUNTER — Emergency Department (HOSPITAL_COMMUNITY)
Admission: EM | Admit: 2014-06-12 | Discharge: 2014-06-12 | Disposition: A | Discharge status: Home / Self Care | Payer: No Typology Code available for payment source | Source: Home / Self Care

## 2014-06-12 DIAGNOSIS — G44229 Chronic tension-type headache, not intractable: Secondary | ICD-10-CM

## 2014-06-12 MED ORDER — KETOROLAC TROMETHAMINE 60 MG/2ML IM SOLN
60.0000 mg | Freq: Once | INTRAMUSCULAR | Status: AC
  Administered 2014-06-12: 60 mg via INTRAMUSCULAR

## 2014-06-12 MED ORDER — MELOXICAM 15 MG PO TABS: 15.0000 mg | ORAL_TABLET | Freq: Every day | ORAL | Status: DC

## 2014-06-12 MED ORDER — KETOROLAC TROMETHAMINE 60 MG/2ML IM SOLN
INTRAMUSCULAR | Status: AC
  Filled 2014-06-12: qty 2

## 2014-06-12 MED ORDER — TRAMADOL HCL 50 MG PO TABS: 50.0000 mg | ORAL_TABLET | Freq: Four times a day (QID) | ORAL | Status: DC | PRN

## 2014-06-12 NOTE — ED Notes (Signed)
Headache off and on for couple of weeks. NAD at present. Hurts right eye and back of head

## 2014-06-12 NOTE — Discharge Instructions (Signed)
Headaches, Frequently Asked Questions °MIGRAINE HEADACHES °Q: What is migraine? What causes it? How can I treat it? °A: Generally, migraine headaches begin as a dull ache. Then they develop into a constant, throbbing, and pulsating pain. You may experience pain at the temples. You may experience pain at the front or back of one or both sides of the head. The pain is usually accompanied by a combination of: °· Nausea. °· Vomiting. °· Sensitivity to light and noise. °Some people (about 15%) experience an aura (see below) before an attack. The cause of migraine is believed to be chemical reactions in the brain. Treatment for migraine may include over-the-counter or prescription medications. It may also include self-help techniques. These include relaxation training and biofeedback.  °Q: What is an aura? °A: About 15% of people with migraine get an "aura". This is a sign of neurological symptoms that occur before a migraine headache. You may see wavy or jagged lines, dots, or flashing lights. You might experience tunnel vision or blind spots in one or both eyes. The aura can include visual or auditory hallucinations (something imagined). It may include disruptions in smell (such as strange odors), taste or touch. Other symptoms include: °· Numbness. °· A "pins and needles" sensation. °· Difficulty in recalling or speaking the correct word. °These neurological events may last as long as 60 minutes. These symptoms will fade as the headache begins. °Q: What is a trigger? °A: Certain physical or environmental factors can lead to or "trigger" a migraine. These include: °· Foods. °· Hormonal changes. °· Weather. °· Stress. °It is important to remember that triggers are different for everyone. To help prevent migraine attacks, you need to figure out which triggers affect you. Keep a headache diary. This is a good way to track triggers. The diary will help you talk to your healthcare professional about your condition. °Q: Does  weather affect migraines? °A: Bright sunshine, hot, humid conditions, and drastic changes in barometric pressure may lead to, or "trigger," a migraine attack in some people. But studies have shown that weather does not act as a trigger for everyone with migraines. °Q: What is the link between migraine and hormones? °A: Hormones start and regulate many of your body's functions. Hormones keep your body in balance within a constantly changing environment. The levels of hormones in your body are unbalanced at times. Examples are during menstruation, pregnancy, or menopause. That can lead to a migraine attack. In fact, about three quarters of all women with migraine report that their attacks are related to the menstrual cycle.  °Q: Is there an increased risk of stroke for migraine sufferers? °A: The likelihood of a migraine attack causing a stroke is very remote. That is not to say that migraine sufferers cannot have a stroke associated with their migraines. In persons under age 40, the most common associated factor for stroke is migraine headache. But over the course of a person's normal life span, the occurrence of migraine headache may actually be associated with a reduced risk of dying from cerebrovascular disease due to stroke.  °Q: What are acute medications for migraine? °A: Acute medications are used to treat the pain of the headache after it has started. Examples over-the-counter medications, NSAIDs, ergots, and triptans.  °Q: What are the triptans? °A: Triptans are the newest class of abortive medications. They are specifically targeted to treat migraine. Triptans are vasoconstrictors. They moderate some chemical reactions in the brain. The triptans work on receptors in your brain. Triptans help   to restore the balance of a neurotransmitter called serotonin. Fluctuations in levels of serotonin are thought to be a main cause of migraine.  °Q: Are over-the-counter medications for migraine effective? °A:  Over-the-counter, or "OTC," medications may be effective in relieving mild to moderate pain and associated symptoms of migraine. But you should see your caregiver before beginning any treatment regimen for migraine.  °Q: What are preventive medications for migraine? °A: Preventive medications for migraine are sometimes referred to as "prophylactic" treatments. They are used to reduce the frequency, severity, and length of migraine attacks. Examples of preventive medications include antiepileptic medications, antidepressants, beta-blockers, calcium channel blockers, and NSAIDs (nonsteroidal anti-inflammatory drugs). °Q: Why are anticonvulsants used to treat migraine? °A: During the past few years, there has been an increased interest in antiepileptic drugs for the prevention of migraine. They are sometimes referred to as "anticonvulsants". Both epilepsy and migraine may be caused by similar reactions in the brain.  °Q: Why are antidepressants used to treat migraine? °A: Antidepressants are typically used to treat people with depression. They may reduce migraine frequency by regulating chemical levels, such as serotonin, in the brain.  °Q: What alternative therapies are used to treat migraine? °A: The term "alternative therapies" is often used to describe treatments considered outside the scope of conventional Western medicine. Examples of alternative therapy include acupuncture, acupressure, and yoga. Another common alternative treatment is herbal therapy. Some herbs are believed to relieve headache pain. Always discuss alternative therapies with your caregiver before proceeding. Some herbal products contain arsenic and other toxins. °TENSION HEADACHES °Q: What is a tension-type headache? What causes it? How can I treat it? °A: Tension-type headaches occur randomly. They are often the result of temporary stress, anxiety, fatigue, or anger. Symptoms include soreness in your temples, a tightening band-like sensation  around your head (a "vice-like" ache). Symptoms can also include a pulling feeling, pressure sensations, and contracting head and neck muscles. The headache begins in your forehead, temples, or the back of your head and neck. Treatment for tension-type headache may include over-the-counter or prescription medications. Treatment may also include self-help techniques such as relaxation training and biofeedback. °CLUSTER HEADACHES °Q: What is a cluster headache? What causes it? How can I treat it? °A: Cluster headache gets its name because the attacks come in groups. The pain arrives with little, if any, warning. It is usually on one side of the head. A tearing or bloodshot eye and a runny nose on the same side of the headache may also accompany the pain. Cluster headaches are believed to be caused by chemical reactions in the brain. They have been described as the most severe and intense of any headache type. Treatment for cluster headache includes prescription medication and oxygen. °SINUS HEADACHES °Q: What is a sinus headache? What causes it? How can I treat it? °A: When a cavity in the bones of the face and skull (a sinus) becomes inflamed, the inflammation will cause localized pain. This condition is usually the result of an allergic reaction, a tumor, or an infection. If your headache is caused by a sinus blockage, such as an infection, you will probably have a fever. An x-ray will confirm a sinus blockage. Your caregiver's treatment might include antibiotics for the infection, as well as antihistamines or decongestants.  °REBOUND HEADACHES °Q: What is a rebound headache? What causes it? How can I treat it? °A: A pattern of taking acute headache medications too often can lead to a condition known as "rebound headache."   A pattern of taking too much headache medication includes taking it more than 2 days per week or in excessive amounts. That means more than the label or a caregiver advises. With rebound  headaches, your medications not only stop relieving pain, they actually begin to cause headaches. Doctors treat rebound headache by tapering the medication that is being overused. Sometimes your caregiver will gradually substitute a different type of treatment or medication. Stopping may be a challenge. Regularly overusing a medication increases the potential for serious side effects. Consult a caregiver if you regularly use headache medications more than 2 days per week or more than the label advises. ADDITIONAL QUESTIONS AND ANSWERS Q: What is biofeedback? A: Biofeedback is a self-help treatment. Biofeedback uses special equipment to monitor your body's involuntary physical responses. Biofeedback monitors:  Breathing.  Pulse.  Heart rate.  Temperature.  Muscle tension.  Brain activity. Biofeedback helps you refine and perfect your relaxation exercises. You learn to control the physical responses that are related to stress. Once the technique has been mastered, you do not need the equipment any more. Q: Are headaches hereditary? A: Four out of five (80%) of people that suffer report a family history of migraine. Scientists are not sure if this is genetic or a family predisposition. Despite the uncertainty, a child has a 50% chance of having migraine if one parent suffers. The child has a 75% chance if both parents suffer.  Q: Can children get headaches? A: By the time they reach high school, most young people have experienced some type of headache. Many safe and effective approaches or medications can prevent a headache from occurring or stop it after it has begun.  Q: What type of doctor should I see to diagnose and treat my headache? A: Start with your primary caregiver. Discuss his or her experience and approach to headaches. Discuss methods of classification, diagnosis, and treatment. Your caregiver may decide to recommend you to a headache specialist, depending upon your symptoms or other  physical conditions. Having diabetes, allergies, etc., may require a more comprehensive and inclusive approach to your headache. The National Headache Foundation will provide, upon request, a list of [facility] physician members in your state. Document Released: 01/22/2004 Document Revised: 01/24/2012 Document Reviewed: 07/01/2008 [facility] Patient Information 2015 [place], [place]. This information is not intended to replace advice given to you by your health care provider. Make sure you discuss any questions you have with your health care provider.  ?au ??u Do C?ng Th?ng (Tension Headache) ?au ??u do c?ng th?ng l m?t c?m gic ?au, p l?c ho?c ?au th??ng c?m th?y pha tr??c v hai bn ??u. C?n ?au c th? khng r rng ho?c c th? c?m th?y ch?t (th?t). ?y l ki?u ?au ??u ph? bi?n nh?t. ?au ??u c?ng th?ng th??ng khng km theo bu?n nn ho?c nn m?a v khng tr? nn t?i t? h?n v?i ho?t ??ng th? ch?t. ?au ??u do c?ng th?ng c th? ko di 30 pht ??n vi ngy.  NGUYN NHN Nguyn nhn chnh xc khng ???c xc ??nh, nh?ng c th? gy ra b?i ha ch?t v hocmon trong no d?n ??n ?au. ?au ??u do c?ng th?ng th??ng b?t ??u sau khi c?ng th?ng, lo u ho?c tr?m c?m. Cc tc nhn khc c th? bao g?m:  R??u.  Caffeine (qu nhi?u ho?c thu h?i).  Nhi?m trng h h?p (c?m l?nh, cm, vim xoang).  V?n ?? nha khoa ho?c si?t ch?t r?ng.  M?t m?i.  Gi? ??u v c?  c?a b?n ? m?t v? tr qu lu trong khi s? d?ng my tnh. TRI?U CH?NG  C?ng th?ng xung quanh ??u.  ?au ??u khng r rng.  C?m th?y ?au pha tr??c v hai bn ??u.  Nh?y c?m ?au ? cc c? c?a ??u, c? v vai. CH?N ?ON ?au ??u do c?ng th?ng th??ng ???c ch?n ?on d?a vo:  Cc tri?u ch?ng.  Khm th?c th?.  Ch?p CT ho?c MRI ??u c?a b?n. C th? yu c?u cc xt nghi?m n?u c cc tri?u ch?ng n?ng ho?c b?t th??ng. ?I?U TR? Thu?c c th? ???c ch? ??nh ?? gip gi?m cc tri?u ch?ng. H??NG D?N CH?M [place] T?I NH  Ch? s? d?ng thu?ckhng c?n k toa ho?c thu?c c?n k  toa ?? gi?m ?au ho?c gi?m c?m gic kh ch?u theo ch? d?n c?a chuyn gia ch?m [place] s?c kh?e c?a b?n.  N?m xu?ng trong m?t c?n phng t?i, yn t?nh, khi b?n b? ?au ??u.  Ghi chp l?i ?? tm hi?u nh?ng g c th? gy ra ch?ng ?au ??u c?a b?n. V d?, ghi l?i:  Nh?ng g b?n ?n v u?ng.  B?n ng? ???c bao lu.  B?t k? thay ??i no c?a ch? ?? ?n u?ng ho?c cc lo?i thu?c c?a b?n.  Hy th? xoa bp ho?c cc k? thu?t th? gin khc.  C th? ch??m n??c ? ho?c nng ln ??u v c?. S? d?ng cc cch ny t? 3 ??n 4 l?n m?i ngy trong 15 ??n 20 pht m?i l?n, ho?c khi c?n thi?t.  H?n ch? c?ng th?ng.  Ng?i th?ng v khng lm c?ng c? c?a b?n.  B? thu?c l, n?u b?n ht thu?c.  H?n ch? s? d?ng r??u.  Gi?m l??ng caffeine b?n u?ng ho?c ng?ng u?ng caffeine.  ?n v t?p th? d?c th??ng xuyn.  Ng? t? 7 ??n 9 ti?ng, ho?c theo l?i khuyn c?a chuyn gia ch?m [place] s?c kh?e.  [place] s? d?ng thu?c gi?m ?au qu m??c, vi? co? th? xa?y ra ?au ??u ti pht. HY ?I KHM N?U:  B?n c cc v?n ?? v?i cc lo?i thu?c ???c ch? ??nh.  Thu?c c?a b?n khng c tc d?ng.  ?au ??u thng th??ng thay ??i khng bnh th??ng.  B?n b? bu?n nn ho?c nn m?a. HY NGAY L?P T?C ?I KHM N?U:  C?n ?au ??u c?a b?n tr? nn tr?m tr?ng.  B?n b? s?t.  B?n b? c?ng c?.  B?n b? m?t th? l?c.  B?n b? y?u c? ho?c m?t ki?m sot c? b?p.  B?n b? m?t th?ng b?ng ho?c c v?n ?? v?i vi?c ?i b?.  B?n c?m th?y mu?n ng?t ho?c ng?t.  B?n c nh?ng tri?u ch?ng nghim tr?ng khc v?i cc tri?u ch?ng ??u tin. ??M B?O B?N:  Hi?u cc h??ng d?n ny.  S? theo di tnh tr?ng c?a mnh.  S? yu c?u tr? gip ngay l?p t?c n?u b?n c?m th?y khng ?? ho?c tnh tr?ng tr?m tr?ng h?n. Document Released: 11/01/2005 Document Revised: 07/04/2013 [facility] Patient Information 2015 [place], [place]. This information is not intended to replace advice given to you by your health care provider. Make sure you discuss any questions you have with your health care  provider.

## 2014-06-12 NOTE — ED Provider Notes (Signed)
CSN: [id number]     Arrival date & time 06/12/14  1516 History   First MD Initiated Contact with Patient 06/12/14 1530     Chief Complaint  Patient presents with  . Headache   (Consider location/radiation/quality/duration/timing/severity/associated sxs/prior Treatment) HPI Comments: 43 yo vietnamese F with headaches for 3 years. Worse past 2 weeks. Some dizziness and eyes itch and burn. Pain located in occiput and vertex. Pain in bridge of trapezii and para cervical muscles.    History reviewed. No pertinent past medical history. Past Surgical History  Procedure Laterality Date  . Tubal ligation  06/10/2011    Procedure: POST PARTUM TUBAL LIGATION;  Surgeon: Tereso NewcomerUgonna A Anyanwu, MD;  Location: [facility];  Service: Gynecology;  Laterality: Bilateral;   History reviewed. No pertinent family history. History  Substance Use Topics  . Smoking status: Never Smoker   . Smokeless tobacco: Never Used  . Alcohol Use: No   OB History   Grav Para Term Preterm Abortions TAB SAB Ect Mult Living   5 5 5       5      Review of Systems  Constitutional: Positive for activity change. Negative for fever and fatigue.  HENT: Negative for congestion, ear discharge, postnasal drip, sore throat and trouble swallowing.   Eyes: Positive for itching. Negative for photophobia, discharge, redness and visual disturbance.  Respiratory: Negative.   Cardiovascular: Negative for chest pain and leg swelling.  Gastrointestinal: Negative for nausea, vomiting and abdominal pain.  Genitourinary: Negative.   Musculoskeletal: Positive for myalgias and neck pain. Negative for neck stiffness.  Skin: Negative for rash.  Neurological: Positive for dizziness and headaches. Negative for seizures, syncope and speech difficulty.  Psychiatric/Behavioral: Negative.     Allergies  Review of patient's allergies indicates no known allergies.  Home Medications   Prior to Admission medications   Medication Sig Start Date End Date  Taking? Authorizing Provider  meloxicam (MOBIC) 15 MG tablet Take 1 tablet (15 mg total) by mouth daily. 06/12/14   Hayden Rasmussenavid Merlinda Wrubel, NP  Prenatal Vit-Fe Fumarate-FA (PRENATAL PLUS) 65-1 MG TABS Take 1 tablet by mouth daily.      Historical Provider, MD  traMADol (ULTRAM) 50 MG tablet Take 1 tablet (50 mg total) by mouth every 6 (six) hours as needed. 06/12/14   Hayden Rasmussenavid Bonney Berres, NP   BP 122/79  Pulse 74  Temp(Src) 98.1 F (36.7 C) (Oral)  Resp 20  SpO2 100% Physical Exam  Nursing note and vitals reviewed. Constitutional: She appears well-developed and well-nourished. No distress.  HENT:  Head: Normocephalic and atraumatic.  Right Ear: External ear normal.  Left Ear: External ear normal.  Nose: Nose normal.  Mouth/Throat: Oropharynx is clear and moist. No oropharyngeal exudate.  Eyes: Conjunctivae and EOM are normal. Pupils are equal, round, and reactive to light.  Neck: Neck supple.  Tenderness to paracervical muscles and bilateral trapezii muscles L>R. Tender base of occiput.  Cardiovascular: Normal rate and regular rhythm.   Pulmonary/Chest: Effort normal and breath sounds normal. No respiratory distress. She has no wheezes.  Abdominal: Soft. Bowel sounds are normal.  Musculoskeletal: She exhibits no edema and no tenderness.  Lymphadenopathy:    She has no cervical adenopathy.  Neurological: She is alert. No cranial nerve deficit. She exhibits normal muscle tone. Coordination normal.  Skin: Skin is warm and dry.  Psychiatric: She has a normal mood and affect.    ED Course  Procedures (including critical care time) Labs Review Labs Reviewed - No data to display  Imaging Review No results found.   MDM   1. Chronic tension-type headache, not intractable     Neuro exam is unremarkable mobic for pain and for short term trmadol #15 F/U with PCP in August, keep appt.      Hayden Rasmussen, NP 06/12/14 1601

## 2014-06-12 NOTE — ED Provider Notes (Signed)
Medical screening examination/treatment/procedure(s) were performed by resident physician or non-physician practitioner and as supervising physician I was immediately available for consultation/collaboration.   Haruo Stepanek DOUGLAS MD.   Brighten Buzzelli D Jedediah Noda, MD 06/12/14 2019 

## 2014-09-16 ENCOUNTER — Encounter (HOSPITAL_COMMUNITY): Payer: Self-pay | Admitting: Emergency Medicine

## 2015-10-22 ENCOUNTER — Ambulatory Visit: Payer: Self-pay | Attending: Family Medicine

## 2016-02-16 ENCOUNTER — Encounter: Payer: Self-pay | Admitting: Family Medicine

## 2016-02-16 ENCOUNTER — Ambulatory Visit (INDEPENDENT_AMBULATORY_CARE_PROVIDER_SITE_OTHER): Payer: No Typology Code available for payment source | Admitting: Family Medicine

## 2016-02-16 VITALS — BP 118/72 | HR 71 | Temp 98.1°F | Ht <= 58 in | Wt 118.0 lb

## 2016-02-16 DIAGNOSIS — Z0000 Encounter for general adult medical examination without abnormal findings: Secondary | ICD-10-CM

## 2016-02-16 DIAGNOSIS — Z7689 Persons encountering health services in other specified circumstances: Secondary | ICD-10-CM

## 2016-02-16 DIAGNOSIS — Z1239 Encounter for other screening for malignant neoplasm of breast: Secondary | ICD-10-CM | POA: Insufficient documentation

## 2016-02-16 DIAGNOSIS — Z7189 Other specified counseling: Secondary | ICD-10-CM

## 2016-02-16 DIAGNOSIS — K029 Dental caries, unspecified: Secondary | ICD-10-CM

## 2016-02-16 DIAGNOSIS — K219 Gastro-esophageal reflux disease without esophagitis: Secondary | ICD-10-CM

## 2016-02-16 LAB — COMPLETE METABOLIC PANEL WITH GFR
ALBUMIN: 4.5 g/dL (ref 3.6–5.1)
ALK PHOS: 40 U/L (ref 33–115)
ALT: 9 U/L (ref 6–29)
AST: 16 U/L (ref 10–35)
BILIRUBIN TOTAL: 0.7 mg/dL (ref 0.2–1.2)
BUN: 9 mg/dL (ref 7–25)
CALCIUM: 9.6 mg/dL (ref 8.6–10.2)
CHLORIDE: 102 mmol/L (ref 98–110)
CO2: 24 mmol/L (ref 20–31)
CREATININE: 0.58 mg/dL (ref 0.50–1.10)
GFR, Est Non African American: >89 mL/min (ref >=60)
Glucose, Bld: 62 mg/dL — ABNORMAL LOW (ref 65–99)
Potassium: 3.6 mmol/L (ref 3.5–5.3)
Sodium: 138 mmol/L (ref 135–146)
Total Protein: 7.9 g/dL (ref 6.1–8.1)

## 2016-02-16 LAB — LIPID PANEL
Cholesterol: 178 mg/dL (ref 125–200)
HDL: 46 mg/dL (ref >=46)
LDL Cholesterol: 111 mg/dL (ref <130)
TRIGLYCERIDES: 105 mg/dL (ref <150)
Total CHOL/HDL Ratio: 3.9 Ratio (ref <=5.0)
VLDL: 21 mg/dL (ref <30)

## 2016-02-16 LAB — CBC WITH DIFFERENTIAL/PLATELET
BASOS PCT: 0 %
Basophils Absolute: 0 cells/uL (ref 0–200)
Eosinophils Absolute: 324 cells/uL (ref 15–500)
Eosinophils Relative: 4 %
HEMATOCRIT: 43.3 % (ref 35.0–45.0)
Hemoglobin: 14.3 g/dL (ref 11.7–15.5)
LYMPHS PCT: 33 %
Lymphs Abs: 2673 cells/uL (ref 850–3900)
MCH: 27.4 pg (ref 27.0–33.0)
MCHC: 33 g/dL (ref 32.0–36.0)
MCV: 83.1 fL (ref 80.0–100.0)
MPV: 9.7 fL (ref 7.5–12.5)
Monocytes Absolute: 486 cells/uL (ref 200–950)
Monocytes Relative: 6 %
Neutro Abs: 4617 cells/uL (ref 1500–7800)
Neutrophils Relative %: 57 %
PLATELETS: 251 10*3/uL (ref 140–400)
RBC: 5.21 MIL/uL — AB (ref 3.80–5.10)
RDW: 14.6 % (ref 11.0–15.0)
WBC: 8.1 10*3/uL (ref 3.8–10.8)

## 2016-02-16 LAB — HIV ANTIBODY (ROUTINE TESTING W REFLEX): HIV 1&2 Ab, 4th Generation: NONREACTIVE

## 2016-02-16 MED ORDER — PANTOPRAZOLE SODIUM 40 MG PO TBEC: 40.0000 mg | DELAYED_RELEASE_TABLET | Freq: Every day | ORAL | Status: DC

## 2016-02-16 NOTE — Patient Instructions (Addendum)
Call [phone] to make an appointment to have a mammogram for breast cancer screening Make an appointment to come back in one month for PAP smear We will let you know if you need any additional medication once we get your lab work. Try to eat a diet low in fat and cholesterol and sweets Try to exercise 5 out of 7 days for 30 minutes. Walking is good.

## 2016-02-16 NOTE — Progress Notes (Signed)
Patient ID: Kim Washington, female   DOB: 1971/05/28, 45 y.o.   MRN: [id number]   Kim Washington, is a 45 y.o. female  [id number]  [id number]  DOB - 1971-01-14  CC:  Chief Complaint  Patient presents with  . new patient/get established    complains of  nausea and heartburn, would like dental referral for a hole in tooth       HPI: Kim Washington is a 45 y.o. female here to establish care. She was seen at urgent care recently for headache which has since resolved. She was referred here to establish primary care.  Her only complaint today is of frequent heartburn and associated nausea. She is currently on no medications. She denies tobacco use, alcohol use or illicit drug use.  He reports not exercising regularly and not following any particular dietary plan.She also ask for dental referral  Health Maintenance. Tdap is up to date. Is in need of mammogram and PAP.  No Known Allergies History reviewed. No pertinent past medical history. Current Outpatient Prescriptions on File Prior to Visit  Medication Sig Dispense Refill  . meloxicam (MOBIC) 15 MG tablet Take 1 tablet (15 mg total) by mouth daily. 14 tablet 0  . Prenatal Vit-Fe Fumarate-FA (PRENATAL PLUS) 65-1 MG TABS Take 1 tablet by mouth daily.      . traMADol (ULTRAM) 50 MG tablet Take 1 tablet (50 mg total) by mouth every 6 (six) hours as needed. 15 tablet 0   No current facility-administered medications on file prior to visit.   History reviewed. No pertinent family history. Social History   Social History  . Marital Status: Married    Spouse Name: N/A  . Number of Children: N/A  . Years of Education: N/A   Occupational History  . Not on file.   Social History Main Topics  . Smoking status: Never Smoker   . Smokeless tobacco: Never Used  . Alcohol Use: No  . Drug Use: No  . Sexual Activity: Yes    Birth Control/ Protection: Surgical   Other Topics Concern  . Not on file   Social History Narrative    Review of  Systems: Constitutional: Negative for fever, chills, appetite change, weight loss,  Fatigue. Skin: Negative for rashes or lesions of concern. HENT: Negative for ear pain, ear discharge.nose bleeds Eyes: Negative for pain, discharge, redness, itching and visual disturbance. Neck: Negative for pain, stiffness Respiratory: Negative for cough, shortness of breath,   Cardiovascular: Negative for chest pain, palpitations and leg swelling. Gastrointestinal: Negative for abdominal pain, nausea, vomiting, diarrhea, constipations Genitourinary: Negative for dysuria, urgency, frequency, hematuria,  Musculoskeletal: Negative for back pain, joint pain, joint  swelling, and gait problem.Negative for weakness. Neurological: Negative for dizziness, tremors, seizures, syncope,   light-headedness, numbness and headaches.  Hematological: Negative for easy bruising or bleeding Psychiatric/Behavioral: Negative for depression, anxiety, decreased concentration, confusion   Objective:   Filed Vitals:   02/16/16 1128  BP: 118/72  Pulse: 71  Temp: 98.1 F (36.7 C)    Physical Exam: Constitutional: Patient appears well-developed and well-nourished. No distress. HENT: Normocephalic, atraumatic, External right and left ear normal. Oropharynx is clear and moist.  Eyes: Conjunctivae and EOM are normal. PERRLA, no scleral icterus. Neck: Normal ROM. Neck supple. No lymphadenopathy, No thyromegaly. CVS: RRR, S1/S2 +, no murmurs, no gallops, no rubs Pulmonary: Effort and breath sounds normal, no stridor, rhonchi, wheezes, rales.  Abdominal: Soft. Normoactive BS,, no distension, tenderness, rebound or guarding.  Musculoskeletal: Normal range of motion.  No edema and no tenderness.  Neuro: Alert.Normal muscle tone coordination. Non-focal Skin: Skin is warm and dry. No rash noted. Not diaphoretic. No erythema. No pallor. Psychiatric: Normal mood and affect. Behavior, judgment, thought content normal.  Lab Results   Component Value Date   WBC 15.2* 06/08/2011   HGB 16.3* 12/31/2013   HCT 48.0* 12/31/2013   MCV 82.0 06/08/2011   PLT 203 06/08/2011   Lab Results  Component Value Date   CREATININE 0.70 12/31/2013   BUN 8 12/31/2013   NA 139 12/31/2013   K 3.9 12/31/2013   CL 101 12/31/2013    No results found for: HGBA1C Lipid Panel  No results found for: CHOL, TRIG, HDL, CHOLHDL, VLDL, LDLCALC     Assessment and plan:   1. Health care maintenance  - COMPLETE METABOLIC PANEL WITH GFR - CBC with Differential - Lipid panel - HIV antibody (with reflex) - MM DIGITAL SCREENING BILATERAL; Future  2. Gastroesophageal reflux disease, esophagitis presence not specified  - pantoprazole (PROTONIX) 40 MG tablet; Take 1 tablet (40 mg total) by mouth daily.  Dispense: 30 tablet; Refill: 3  3. Need for Cervical cancer screening -Return in about a month for a PAP smear.  4. Dental caries -referral to dentist.  5. Visit to establish care -I have reviewed information provided by the patient, with the aid of an interpreter, and have reviewed pertinent information from her chart.   Return in about 1 month (around 03/17/2016).  The patient was given clear instructions to go to ER or return to medical center if symptoms don't improve, worsen or new problems develop. The patient verbalized understanding.    Henrietta HooverLinda C Dequandre Cordova FNP  02/16/2016, 12:00 PM

## 2016-03-10 ENCOUNTER — Other Ambulatory Visit: Payer: Self-pay | Admitting: Family Medicine

## 2016-03-10 DIAGNOSIS — Z1231 Encounter for screening mammogram for malignant neoplasm of breast: Secondary | ICD-10-CM

## 2016-03-24 ENCOUNTER — Ambulatory Visit: Payer: No Typology Code available for payment source

## 2016-03-26 ENCOUNTER — Encounter: Payer: Self-pay | Admitting: Family Medicine

## 2016-03-26 ENCOUNTER — Other Ambulatory Visit (HOSPITAL_COMMUNITY)
Admission: RE | Admit: 2016-03-26 | Discharge: 2016-03-26 | Disposition: A | Discharge status: Home / Self Care | Payer: No Typology Code available for payment source | Source: Ambulatory Visit | Attending: Family Medicine | Admitting: Family Medicine

## 2016-03-26 ENCOUNTER — Ambulatory Visit (INDEPENDENT_AMBULATORY_CARE_PROVIDER_SITE_OTHER): Payer: No Typology Code available for payment source | Admitting: Family Medicine

## 2016-03-26 VITALS — BP 110/62 | HR 68 | Temp 98.7°F | Resp 18 | Ht 59.0 in | Wt 126.0 lb

## 2016-03-26 DIAGNOSIS — Z1151 Encounter for screening for human papillomavirus (HPV): Secondary | ICD-10-CM | POA: Insufficient documentation

## 2016-03-26 DIAGNOSIS — Z124 Encounter for screening for malignant neoplasm of cervix: Secondary | ICD-10-CM

## 2016-03-26 DIAGNOSIS — Z01419 Encounter for gynecological examination (general) (routine) without abnormal findings: Secondary | ICD-10-CM | POA: Insufficient documentation

## 2016-03-26 NOTE — Progress Notes (Signed)
Patient ID: Kim Washington, female   DOB: 06/12/1971, 45 y.o.   MRN: [id number]   Kim Washington, is a 45 y.o. female  [id number]  [id number]  DOB - 03/11/1971  CC:  Chief Complaint  Patient presents with  . Gynecologic Exam       HPI: Kim Washington is a 45 y.o. female presents today for a PAP smear. We addressed her health maintenance needs at her last visit, but were unable to do a PAP at that time. She has not had a PAP in the recent past. We are simply collecting the PAP  No Known Allergies History reviewed. No pertinent past medical history. Current Outpatient Prescriptions on File Prior to Visit  Medication Sig Dispense Refill  . pantoprazole (PROTONIX) 40 MG tablet Take 1 tablet (40 mg total) by mouth daily. 30 tablet 3   No current facility-administered medications on file prior to visit.   History reviewed. No pertinent family history. Social History   Social History  . Marital Status: Married    Spouse Name: N/A  . Number of Children: N/A  . Years of Education: N/A   Occupational History  . Not on file.   Social History Main Topics  . Smoking status: Never Smoker   . Smokeless tobacco: Never Used  . Alcohol Use: No  . Drug Use: No  . Sexual Activity: Yes    Birth Control/ Protection: Surgical   Other Topics Concern  . Not on file   Social History Narrative     Objective:   Filed Vitals:   03/26/16 1343  BP: 110/62  Pulse: 68  Temp: 98.7 F (37.1 C)  Resp: 18     Lab Results  Component Value Date   WBC 8.1 02/16/2016   HGB 14.3 02/16/2016   HCT 43.3 02/16/2016   MCV 83.1 02/16/2016   PLT 251 02/16/2016   Lab Results  Component Value Date   CREATININE 0.58 02/16/2016   BUN 9 02/16/2016   NA 138 02/16/2016   K 3.6 02/16/2016   CL 102 02/16/2016   CO2 24 02/16/2016    No results found for: HGBA1C Lipid Panel     Component Value Date/Time   CHOL 178 02/16/2016 1156   TRIG 105 02/16/2016 1156   HDL 46 02/16/2016 1156   CHOLHDL 3.9  02/16/2016 1156   VLDL 21 02/16/2016 1156   LDLCALC 111 02/16/2016 1156       Assessment and plan:   1. Cervical cancer screening  - Cytology - PAP [place]   No Follow-up on file.  The patient was given clear instructions to go to ER or return to medical center if symptoms don't improve, worsen or new problems develop. The patient verbalized understanding.    Henrietta HooverLinda C Derris Millan FNP  03/26/2016, 3:13 PM

## 2016-03-31 LAB — CYTOLOGY - PAP

## 2016-06-16 ENCOUNTER — Telehealth: Payer: Self-pay | Admitting: *Deleted

## 2016-06-16 NOTE — Telephone Encounter (Signed)
Spoke with patient, verified ID, informed of labs, Azithromycin 1gram PO x 1 dose called to [employer] [facility], [facility] [phone], patient informed to abstain for sexual activity x 10 days and notify sexual partners for evaluation and treatment

## 2016-10-06 ENCOUNTER — Emergency Department (HOSPITAL_COMMUNITY): Payer: No Typology Code available for payment source

## 2016-10-06 ENCOUNTER — Emergency Department (HOSPITAL_COMMUNITY)
Admission: EM | Admit: 2016-10-06 | Discharge: 2016-10-06 | Disposition: A | Discharge status: Home / Self Care | Payer: No Typology Code available for payment source | Attending: Emergency Medicine | Admitting: Emergency Medicine

## 2016-10-06 ENCOUNTER — Encounter (HOSPITAL_COMMUNITY): Payer: Self-pay | Admitting: Emergency Medicine

## 2016-10-06 DIAGNOSIS — Y939 Activity, unspecified: Secondary | ICD-10-CM | POA: Insufficient documentation

## 2016-10-06 DIAGNOSIS — M542 Cervicalgia: Secondary | ICD-10-CM | POA: Insufficient documentation

## 2016-10-06 DIAGNOSIS — R220 Localized swelling, mass and lump, head: Secondary | ICD-10-CM | POA: Insufficient documentation

## 2016-10-06 DIAGNOSIS — Y92410 Unspecified street and highway as the place of occurrence of the external cause: Secondary | ICD-10-CM | POA: Insufficient documentation

## 2016-10-06 DIAGNOSIS — S0083XA Contusion of other part of head, initial encounter: Secondary | ICD-10-CM

## 2016-10-06 DIAGNOSIS — Y999 Unspecified external cause status: Secondary | ICD-10-CM | POA: Diagnosis not present

## 2016-10-06 DIAGNOSIS — S0993XA Unspecified injury of face, initial encounter: Secondary | ICD-10-CM | POA: Diagnosis present

## 2016-10-06 MED ORDER — OXYCODONE-ACETAMINOPHEN 5-325 MG PO TABS
1.0000 | ORAL_TABLET | Freq: Once | ORAL | Status: AC
  Administered 2016-10-06: 1 via ORAL
  Filled 2016-10-06: qty 1

## 2016-10-06 MED ORDER — OXYCODONE-ACETAMINOPHEN 5-325 MG PO TABS: 1.0000 | ORAL_TABLET | ORAL | 0 refills | Status: AC | PRN

## 2016-10-06 NOTE — Discharge Instructions (Signed)
The scans of your face were normal-- no broken bones or teeth. The swelling of your lips should gradually improve.  Recommend to apply ice to help with this. Follow-up with your primary care doctor. Return here for new concerns.
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
## 2016-10-06 NOTE — ED Notes (Addendum)
Unable to use translator through translation services, language is not available.

## 2016-10-06 NOTE — ED Notes (Signed)
PT called daughter for translation.

## 2017-11-19 ENCOUNTER — Other Ambulatory Visit: Payer: Self-pay

## 2017-11-19 ENCOUNTER — Encounter (HOSPITAL_COMMUNITY): Payer: Self-pay | Admitting: Emergency Medicine

## 2017-11-19 ENCOUNTER — Ambulatory Visit (INDEPENDENT_AMBULATORY_CARE_PROVIDER_SITE_OTHER): Payer: BLUE CROSS/BLUE SHIELD

## 2017-11-19 ENCOUNTER — Ambulatory Visit (HOSPITAL_COMMUNITY)
Admission: EM | Admit: 2017-11-19 | Discharge: 2017-11-19 | Disposition: A | Discharge status: Home / Self Care | Payer: BLUE CROSS/BLUE SHIELD | Attending: Radiology | Admitting: Radiology

## 2017-11-19 DIAGNOSIS — M25572 Pain in left ankle and joints of left foot: Secondary | ICD-10-CM

## 2017-11-19 DIAGNOSIS — W19XXXA Unspecified fall, initial encounter: Secondary | ICD-10-CM

## 2017-11-19 MED ORDER — NAPROXEN 500 MG PO TABS: 500.0000 mg | ORAL_TABLET | Freq: Two times a day (BID) | ORAL | 0 refills | Status: AC

## 2017-11-19 NOTE — ED Provider Notes (Signed)
[facility]    CSN: [id number] Arrival date & time: 11/19/17  1223     History   Chief Complaint Chief Complaint  Patient presents with  . Fall  . Leg Pain    HPI Kim Washington is a 47 y.o. female.   47 y.o. female presents with left ankle pain that occurred when  1 day prior she fell at work. Patient states that she was pushing something when she fell on the cement ground from standing position. Patient states that the pain radiates up the lateral portion of her left thigh.Patient denies any additional injuries. Condition is acute in nature. Condition is made better by nothing. Condition is made worse by weight bearing activity. Patient denies any treatment prior to there arrival at this facility.        History reviewed. No pertinent past medical history.  Patient Active Problem List   Diagnosis Date Noted  . GERD (gastroesophageal reflux disease) 02/16/2016  . Screening for breast cancer 02/16/2016  . Dental caries 02/16/2016    Past Surgical History:  Procedure Laterality Date  . CESAREAN SECTION    . TUBAL LIGATION  06/10/2011   Procedure: POST PARTUM TUBAL LIGATION;  Surgeon: Tereso NewcomerUgonna A Anyanwu, MD;  Location: [facility];  Service: Gynecology;  Laterality: Bilateral;    OB History    Gravida Para Term Preterm AB Living   5 5 5     5    SAB TAB Ectopic Multiple Live Births           1       Home Medications    Prior to Admission medications   Medication Sig Start Date End Date Taking? Authorizing Provider  oxyCODONE-acetaminophen (PERCOCET/ROXICET) 5-325 MG tablet Take 1 tablet by mouth every 4 (four) hours as needed. Patient not taking: Reported on 11/19/2017 10/06/16   Garlon HatchetSanders, Lisa M, PA-C  pantoprazole (PROTONIX) 40 MG tablet Take 1 tablet (40 mg total) by mouth daily. Patient not taking: Reported on 10/06/2016 02/16/16   Henrietta HooverBernhardt, Linda C, NP    Family History No family history on file.  Social History Social History   Tobacco Use  . Smoking  status: Never Smoker  . Smokeless tobacco: Never Used  Substance Use Topics  . Alcohol use: No  . Drug use: No     Allergies   Patient has no known allergies.   Review of Systems Review of Systems  Constitutional: Negative for chills and fever.  HENT: Negative for ear pain and sore throat.   Eyes: Negative for pain and visual disturbance.  Respiratory: Negative for cough and shortness of breath.   Cardiovascular: Negative for chest pain and palpitations.  Gastrointestinal: Negative for abdominal pain and vomiting.  Genitourinary: Negative for dysuria and hematuria.  Musculoskeletal: Negative for arthralgias and back pain.       Pain to left ankle lateral aspect with lateral radiation to hip  Skin: Negative for color change and rash.  Neurological: Negative for seizures and syncope.  All other systems reviewed and are negative.    Physical Exam Triage Vital Signs ED Triage Vitals [11/19/17 1326]  Enc Vitals Group     BP (!) 127/99     Pulse Rate 86     Resp 18     Temp 98 F (36.7 C)     Temp src      SpO2 100 %     Weight      Height      Head Circumference  Peak Flow      Pain Score      Pain Loc      Pain Edu?      Excl. in GC?    No data found.  Updated Vital Signs BP (!) 127/99   Pulse 86   Temp 98 F (36.7 C)   Resp 18   LMP 10/19/2017   SpO2 100%       Physical Exam   UC Treatments / Results  Labs (all labs ordered are listed, but only abnormal results are displayed) Labs Reviewed - No data to display  EKG  EKG Interpretation None       Radiology No results found.  Procedures Procedures (including critical care time)  Medications Ordered in UC Medications - No data to display   Initial Impression / Assessment and Plan / UC Course  I have reviewed the triage vital signs and the nursing notes.  Pertinent labs & imaging results that were available during my care of the patient were reviewed by me and considered in my  medical decision making (see chart for details).       Final Clinical Impressions(s) / UC Diagnoses   Final diagnoses:  None    ED Discharge Orders    None       Controlled Substance Prescriptions [place] Controlled Substance Registry consulted? Not Applicable   Alene Mires, NP 11/19/17 1441

## 2017-11-19 NOTE — Discharge Instructions (Signed)
Rest, elevate and put ice on affected extremity.

## 2017-11-19 NOTE — ED Triage Notes (Signed)
Pt states she fell at work today and is having pain on the L side of her body.

## 2017-11-23 ENCOUNTER — Other Ambulatory Visit: Payer: Self-pay

## 2017-11-23 ENCOUNTER — Ambulatory Visit (HOSPITAL_COMMUNITY)
Admission: EM | Admit: 2017-11-23 | Discharge: 2017-11-23 | Disposition: A | Discharge status: Home / Self Care | Payer: BLUE CROSS/BLUE SHIELD | Attending: Urgent Care | Admitting: Urgent Care

## 2017-11-23 ENCOUNTER — Encounter (HOSPITAL_COMMUNITY): Payer: Self-pay | Admitting: Emergency Medicine

## 2017-11-23 ENCOUNTER — Ambulatory Visit (INDEPENDENT_AMBULATORY_CARE_PROVIDER_SITE_OTHER): Payer: BLUE CROSS/BLUE SHIELD

## 2017-11-23 DIAGNOSIS — M25572 Pain in left ankle and joints of left foot: Secondary | ICD-10-CM

## 2017-11-23 DIAGNOSIS — Y990 Civilian activity done for income or pay: Secondary | ICD-10-CM

## 2017-11-23 DIAGNOSIS — M25559 Pain in unspecified hip: Secondary | ICD-10-CM

## 2017-11-23 DIAGNOSIS — M25552 Pain in left hip: Secondary | ICD-10-CM

## 2017-11-23 DIAGNOSIS — M79605 Pain in left leg: Secondary | ICD-10-CM

## 2017-11-23 MED ORDER — MELOXICAM 15 MG PO TABS: 15.0000 mg | ORAL_TABLET | Freq: Every day | ORAL | 1 refills | Status: DC

## 2017-11-23 MED ORDER — KETOROLAC TROMETHAMINE 60 MG/2ML IM SOLN
INTRAMUSCULAR | Status: AC
  Filled 2017-11-23: qty 2

## 2017-11-23 MED ORDER — KETOROLAC TROMETHAMINE 60 MG/2ML IM SOLN
60.0000 mg | Freq: Once | INTRAMUSCULAR | Status: AC
  Administered 2017-11-23: 60 mg via INTRAMUSCULAR

## 2017-11-23 NOTE — ED Provider Notes (Signed)
  MRN: [id number] DOB: 11/21/1970  Subjective:   Kim Washington is a 47 y.o. female presenting for ongoing pain from suffering a fall on 11/18/2017. Patient was seen here for same on 11/19/2017, left ankle x-ray was negative, started naproxen 500mg  twice daily. Today, reports that she feels slightly worse. Reports having pain along left hip, left thigh, left ankle. She has been wearing a cam walker boot as instructed at her last OV. Was written out of work and was scheduled to go back today but she is requesting to held out of work.  Clovis Rileyranh has No Known Allergies.  Clovis Rileyranh denies past medical history. Also  has a past surgical history that includes Tubal ligation (06/10/2011) and Cesarean section.  Objective:   Vitals: BP 118/66   Pulse 81   Temp 97.6 F (36.4 C)   Resp 16   LMP 11/02/2017 (Approximate)   SpO2 100%   Physical Exam  Constitutional: She is oriented to person, place, and time. She appears well-developed and well-nourished.  Cardiovascular: Normal rate.  Pulmonary/Chest: Effort normal.  Musculoskeletal:       Left hip: She exhibits tenderness (over areas depicted). She exhibits normal range of motion, normal strength, no bony tenderness, no swelling, no crepitus, no deformity and no laceration.       Left knee: She exhibits normal range of motion, no swelling, no effusion, no ecchymosis, no deformity, no laceration, no erythema, normal alignment, normal patellar mobility and no bony tenderness. No tenderness found.       Left ankle: She exhibits normal range of motion, no swelling, no ecchymosis, no deformity and no laceration. Tenderness. Lateral malleolus and AITFL tenderness found. No medial malleolus, no CF ligament, no posterior TFL, no head of 5th metatarsal and no proximal fibula tenderness found. Achilles tendon exhibits no pain and no defect.       Left upper leg: She exhibits no tenderness, no bony tenderness, no swelling, no edema, no deformity and no laceration.       Left  lower leg: She exhibits no tenderness, no bony tenderness, no swelling, no edema, no deformity and no laceration.       Legs: Neurological: She is alert and oriented to person, place, and time.   Dg Hip Unilat With Pelvis 2-3 Views Left  Result Date: 11/23/2017 CLINICAL DATA:  Fall, landed on left hip EXAM: DG HIP (WITH OR WITHOUT PELVIS) 2-3V LEFT COMPARISON:  None. FINDINGS: No fracture or dislocation is seen. Bilateral hip joint spaces are preserved. Visualized bony pelvis appears intact. Mild degenerative changes of the lower lumbar spine. IMPRESSION: Negative. Electronically Signed   By: Charline BillsSriyesh  Krishnan M.D.   On: 11/23/2017 15:51   Assessment and Plan :   Left leg pain  Hip pain - Plan: DG Hip Unilat With Pelvis 2-3 Views Left, DG Hip Unilat With Pelvis 2-3 Views Left  Acute left ankle pain  Work related injury  Will refer patient to [facility] for consult on her work related injury and continued management. She may need PT. Stop naproxen, switch to meloxicam.   Wallis BambergMani, Raoul Ciano, PA-C 11/23/17 1605

## 2017-11-23 NOTE — Discharge Instructions (Signed)
Please make an appointment with [facility]. They can be reached at [phone].   [address], [address] [place], [place] [address]

## 2017-11-23 NOTE — ED Triage Notes (Signed)
Pt fell at work and was seen here on 1/5 for it, states she still has pain and nneeds a note to be out of work for longer.

## 2018-09-15 ENCOUNTER — Ambulatory Visit (INDEPENDENT_AMBULATORY_CARE_PROVIDER_SITE_OTHER): Payer: Self-pay | Admitting: Family Medicine

## 2018-09-15 ENCOUNTER — Ambulatory Visit: Payer: BLUE CROSS/BLUE SHIELD | Admitting: Family Medicine

## 2018-09-15 ENCOUNTER — Encounter: Payer: Self-pay | Admitting: Family Medicine

## 2018-09-15 VITALS — BP 120/77 | HR 73 | Temp 97.3°F | Ht 58.5 in | Wt 125.8 lb

## 2018-09-15 DIAGNOSIS — Z7689 Persons encountering health services in other specified circumstances: Secondary | ICD-10-CM

## 2018-09-15 DIAGNOSIS — K219 Gastro-esophageal reflux disease without esophagitis: Secondary | ICD-10-CM

## 2018-09-15 DIAGNOSIS — Z09 Encounter for follow-up examination after completed treatment for conditions other than malignant neoplasm: Secondary | ICD-10-CM

## 2018-09-15 DIAGNOSIS — M79605 Pain in left leg: Secondary | ICD-10-CM

## 2018-09-15 LAB — POCT GLYCOSYLATED HEMOGLOBIN (HGB A1C): Hemoglobin A1C: 5.3 % (ref 4.0–5.6)

## 2018-09-15 LAB — POCT URINALYSIS DIP (MANUAL ENTRY)
Bilirubin, UA: NEGATIVE
Glucose, UA: NEGATIVE mg/dL
Ketones, POC UA: NEGATIVE mg/dL
Nitrite, UA: NEGATIVE
Protein Ur, POC: NEGATIVE mg/dL
Spec Grav, UA: 1.02 (ref 1.010–1.025)
Urobilinogen, UA: 0.2 E.U./dL
pH, UA: 6.5 (ref 5.0–8.0)

## 2018-09-15 MED ORDER — PANTOPRAZOLE SODIUM 40 MG PO TBEC: 40.0000 mg | DELAYED_RELEASE_TABLET | Freq: Every day | ORAL | 3 refills | Status: AC

## 2018-09-15 MED ORDER — MELOXICAM 15 MG PO TABS: 15.0000 mg | ORAL_TABLET | Freq: Every day | ORAL | 2 refills | Status: AC

## 2018-09-15 MED FILL — MELOXICAM 15 MG TABLET: 15 | 30 days supply | Qty: 30 | Fill #0

## 2018-09-15 MED FILL — PANTOPRAZOLE SOD DR 40 MG T: 40 | 30 days supply | Qty: 30 | Fill #0

## 2018-09-15 NOTE — Progress Notes (Signed)
New Patient--Establish Care  Subjective:    Patient ID: Kim Washington, female    DOB: 1970/11/18, 47 y.o.   MRN: [id number]   Chief Complaint  Patient presents with  . Establish Care  . Leg Pain    left leg since jan.    HPI  Kim Washington is a 47 year old female with a past medical history of Left Leg Pain. She is here today to Establish Care.   Current Status: She is continuing to have pain in her left leg that radiates to her left hip for about 1 year. She suffered a fall, on the the job on 11/18/2017, and was getting Worker's Compensation and receiving Physical Therapy for pain. She was out of work for a short time, than returned to work light duty 4 hours day from January 2019 until July 2019. She has since has not been able to return to work for full time duty, and has not been employed now since August. She previously took Naproxen and Mobic for pain. She is currently not taking an medications for pain. Her pain is having moderate pain in her left leg today. She is accompanied today by a friend and an interpreter.   She is currently applying for [facility] Card) for future referral to physical therapy.She denies fevers, chills, fatigue, recent infections, weight loss, and night sweats. She has not had any headaches, visual changes, dizziness, and falls. No chest pain, heart palpitations, cough and shortness of breath reported. No reports of GI problems such as nausea, vomiting, diarrhea, and constipation. She has no reports of blood in stools, dysuria and hematuria. No depression or anxiety reported.   Review of Systems  HENT: Negative.   Respiratory: Negative.   Cardiovascular: Negative.   Gastrointestinal: Negative.   Genitourinary: Negative.   Musculoskeletal: Positive for arthralgias (left leg/hip).  Skin: Negative.   Neurological: Negative.   Psychiatric/Behavioral: Negative.    Objective:   Physical Exam  Constitutional: She is oriented to person, place,  and time. She appears well-developed and well-nourished.  Neck: Normal range of motion. Neck supple.  Cardiovascular: Normal rate, regular rhythm, normal heart sounds and intact distal pulses.  Pulmonary/Chest: Effort normal and breath sounds normal.  Abdominal: Soft. Bowel sounds are normal.  Musculoskeletal:  Limited ROM in left lower extremeity  Neurological: She is alert and oriented to person, place, and time.  Skin: Skin is warm and dry.  Psychiatric: She has a normal mood and affect. Her behavior is normal. Judgment and thought content normal.  Nursing note and vitals reviewed.  Assessment & Plan:   1. Encounter to establish care - POCT urinalysis dipstick - POCT glycosylated hemoglobin (Hb A1C) - CBC with Differential/Platelet - Comprehensive metabolic panel - Lipid panel - TSH  2. Pain of left lower extremity Stable. She was following up with Physical Therapy until she lost her job and her healthcare benefits. We will re-initiate Mobic today. May possibly refer her back to PT if pain worsens. She is currently applying for [employer] medical benefits.  - meloxicam (MOBIC) 15 MG tablet; Take 1 tablet (15 mg total) by mouth daily.  Dispense: 30 tablet; Refill: 2  3. Gastroesophageal reflux disease, esophagitis presence not specified We will refill Protonix today.  - pantoprazole (PROTONIX) 40 MG tablet; Take 1 tablet (40 mg total) by mouth daily.  Dispense: 30 tablet; Refill: 3  4. Follow up She will follow up in 2 months.    Meds ordered this encounter  Medications  . meloxicam (MOBIC) 15 MG tablet    Sig: Take 1 tablet (15 mg total) by mouth daily.    Dispense:  30 tablet    Refill:  2  . pantoprazole (PROTONIX) 40 MG tablet    Sig: Take 1 tablet (40 mg total) by mouth daily.    Dispense:  30 tablet    Refill:  3    Raliegh Ip,  MSN, FNP-C Patient [facility] [address] [place], [place] [address] [phone]

## 2018-09-15 NOTE — Patient Instructions (Addendum)
Meloxicam tablets What is this medicine? MELOXICAM (mel OX i cam) is a non-steroidal anti-inflammatory drug (NSAID). It is used to reduce swelling and to treat pain. It may be used for osteoarthritis, rheumatoid arthritis, or juvenile rheumatoid arthritis. This medicine may be used for other purposes; ask your health care provider or pharmacist if you have questions. COMMON BRAND NAME(S): Mobic What should I tell my health care provider before I take this medicine? They need to know if you have any of these conditions: -bleeding disorders -cigarette smoker -coronary artery bypass graft (CABG) surgery within the past 2 weeks -drink more than 3 alcohol-containing drinks per day -heart disease -high blood pressure -history of stomach bleeding -kidney disease -liver disease -lung or breathing disease, like asthma -stomach or intestine problems -an unusual or allergic reaction to meloxicam, aspirin, other NSAIDs, other medicines, foods, dyes, or preservatives -pregnant or trying to get pregnant -breast-feeding How should I use this medicine? Take this medicine by mouth with a full glass of water. Follow the directions on the prescription label. You can take it with or without food. If it upsets your stomach, take it with food. Take your medicine at regular intervals. Do not take it more often than directed. Do not stop taking except on your doctor's advice. A special MedGuide will be given to you by the pharmacist with each prescription and refill. Be sure to read this information carefully each time. Talk to your pediatrician regarding the use of this medicine in children. While this drug may be prescribed for selected conditions, precautions do apply. Patients over 65 years old may have a stronger reaction and need a smaller dose. Overdosage: If you think you have taken too much of this medicine contact a poison control center or emergency room at once. NOTE: This medicine is only for you. Do  not share this medicine with others. What if I miss a dose? If you miss a dose, take it as soon as you can. If it is almost time for your next dose, take only that dose. Do not take double or extra doses. What may interact with this medicine? Do not take this medicine with any of the following medications: -cidofovir -ketorolac This medicine may also interact with the following medications: -aspirin and aspirin-like medicines -certain medicines for blood pressure, heart disease, irregular heart beat -certain medicines for depression, anxiety, or psychotic disturbances -certain medicines that treat or prevent blood clots like warfarin, enoxaparin, dalteparin, apixaban, dabigatran, rivaroxaban -cyclosporine -digoxin -diuretics -methotrexate -other NSAIDs, medicines for pain and inflammation, like ibuprofen and naproxen -pemetrexed This list may not describe all possible interactions. Give your health care provider a list of all the medicines, herbs, non-prescription drugs, or dietary supplements you use. Also tell them if you smoke, drink alcohol, or use illegal drugs. Some items may interact with your medicine. What should I watch for while using this medicine? Tell your doctor or healthcare professional if your symptoms do not start to get better or if they get worse. Do not take other medicines that contain aspirin, ibuprofen, or naproxen with this medicine. Side effects such as stomach upset, nausea, or ulcers may be more likely to occur. Many medicines available without a prescription should not be taken with this medicine. This medicine can cause ulcers and bleeding in the stomach and intestines at any time during treatment. This can happen with no warning and may cause death. There is increased risk with taking this medicine for a long time. Smoking, drinking alcohol, older age,   and poor health can also increase risks. Call your doctor right away if you have stomach pain or blood in your  vomit or stool. This medicine does not prevent heart attack or stroke. In fact, this medicine may increase the chance of a heart attack or stroke. The chance may increase with longer use of this medicine and in people who have heart disease. If you take aspirin to prevent heart attack or stroke, talk with your doctor or health care professional. What side effects may I notice from receiving this medicine? Side effects that you should report to your doctor or health care professional as soon as possible: -allergic reactions like skin rash, itching or hives, swelling of the face, lips, or tongue -nausea, vomiting -signs and symptoms of a blood clot such as breathing problems; changes in vision; chest pain; severe, sudden headache; pain, swelling, warmth in the leg; trouble speaking; sudden numbness or weakness of the face, arm, or leg -signs and symptoms of bleeding such as bloody or black, tarry stools; red or dark-brown urine; spitting up blood or brown material that looks like coffee grounds; red spots on the skin; unusual bruising or bleeding from the eye, gums, or nose -signs and symptoms of liver injury like dark yellow or brown urine; general ill feeling or flu-like symptoms; light-colored stools; loss of appetite; nausea; right upper belly pain; unusually weak or tired; yellowing of the eyes or skin -signs and symptoms of stroke like changes in vision; confusion; trouble speaking or understanding; severe headaches; sudden numbness or weakness of the face, arm, or leg; trouble walking; dizziness; loss of balance or coordination Side effects that usually do not require medical attention (report to your doctor or health care professional if they continue or are bothersome): -constipation -diarrhea -gas This list may not describe all possible side effects. Call your doctor for medical advice about side effects. You may report side effects to FDA at 1-800-FDA-1088. Where should I keep my  medicine? Keep out of the reach of children. Store at room temperature between 15 and 30 degrees C (59 and 86 degrees F). Throw away any unused medicine after the expiration date. NOTE: This sheet is a summary. It may not cover all possible information. If you have questions about this medicine, talk to your doctor, pharmacist, or health care provider.  2018 Elsevier/Gold Standard (2015-12-03 19:28:16)  Pantoprazole tablets What is this medicine? PANTOPRAZOLE (pan TOE pra zole) prevents the production of acid in the stomach. It is used to treat gastroesophageal reflux disease (GERD), inflammation of the esophagus, and Zollinger-Ellison syndrome. This medicine may be used for other purposes; ask your health care provider or pharmacist if you have questions. COMMON BRAND NAME(S): Protonix What should I tell my health care provider before I take this medicine? They need to know if you have any of these conditions: -liver disease -low levels of magnesium in the blood -lupus -an unusual or allergic reaction to omeprazole, lansoprazole, pantoprazole, rabeprazole, other medicines, foods, dyes, or preservatives -pregnant or trying to get pregnant -breast-feeding How should I use this medicine? Take this medicine by mouth. Swallow the tablets whole with a drink of water. Follow the directions on the prescription label. Do not crush, break, or chew. Take your medicine at regular intervals. Do not take your medicine more often than directed. Talk to your pediatrician regarding the use of this medicine in children. While this drug may be prescribed for children as young as 5 years for selected conditions, precautions do apply. Overdosage:  If you think you have taken too much of this medicine contact a poison control center or emergency room at once. NOTE: This medicine is only for you. Do not share this medicine with others. What if I miss a dose? If you miss a dose, take it as soon as you can. If it  is almost time for your next dose, take only that dose. Do not take double or extra doses. What may interact with this medicine? Do not take this medicine with any of the following medications: -atazanavir -nelfinavir This medicine may also interact with the following medications: -ampicillin -delavirdine -erlotinib -iron salts -medicines for fungal infections like ketoconazole, itraconazole and voriconazole -methotrexate -mycophenolate mofetil -warfarin This list may not describe all possible interactions. Give your health care provider a list of all the medicines, herbs, non-prescription drugs, or dietary supplements you use. Also tell them if you smoke, drink alcohol, or use illegal drugs. Some items may interact with your medicine. What should I watch for while using this medicine? It can take several days before your stomach pain gets better. Check with your doctor or health care professional if your condition does not start to get better, or if it gets worse. You may need blood work done while you are taking this medicine. What side effects may I notice from receiving this medicine? Side effects that you should report to your doctor or health care professional as soon as possible: -allergic reactions like skin rash, itching or hives, swelling of the face, lips, or tongue -bone, muscle or joint pain -breathing problems -chest pain or chest tightness -dark yellow or brown urine -dizziness -fast, irregular heartbeat -feeling faint or lightheaded -fever or sore throat -muscle spasm -palpitations -rash on cheeks or arms that gets worse in the sun -redness, blistering, peeling or loosening of the skin, including inside the mouth -seizures -tremors -unusual bleeding or bruising -unusually weak or tired -yellowing of the eyes or skin Side effects that usually do not require medical attention (report to your doctor or health care professional if they continue or are  bothersome): -constipation -diarrhea -dry mouth -headache -nausea This list may not describe all possible side effects. Call your doctor for medical advice about side effects. You may report side effects to FDA at 1-800-FDA-1088. Where should I keep my medicine? Keep out of the reach of children. Store at room temperature between 15 and 30 degrees C (59 and 86 degrees F). Protect from light and moisture. Throw away any unused medicine after the expiration date. NOTE: This sheet is a summary. It may not cover all possible information. If you have questions about this medicine, talk to your doctor, pharmacist, or health care provider.  2018 Elsevier/Gold Standard (2015-12-04 12:20:19)

## 2018-09-16 LAB — CBC WITH DIFFERENTIAL/PLATELET
Basophils Absolute: 0 10*3/uL (ref 0.0–0.2)
Basos: 1 %
EOS (ABSOLUTE): 0.4 10*3/uL (ref 0.0–0.4)
Eos: 6 %
Hematocrit: 43.2 % (ref 34.0–46.6)
Hemoglobin: 14 g/dL (ref 11.1–15.9)
Immature Grans (Abs): 0 10*3/uL (ref 0.0–0.1)
Immature Granulocytes: 0 %
Lymphocytes Absolute: 1.9 10*3/uL (ref 0.7–3.1)
Lymphs: 30 %
MCH: 27.2 pg (ref 26.6–33.0)
MCHC: 32.4 g/dL (ref 31.5–35.7)
MCV: 84 fL (ref 79–97)
Monocytes Absolute: 0.5 10*3/uL (ref 0.1–0.9)
Monocytes: 8 %
Neutrophils Absolute: 3.4 10*3/uL (ref 1.4–7.0)
Neutrophils: 55 %
Platelets: 326 10*3/uL (ref 150–450)
RBC: 5.15 x10E6/uL (ref 3.77–5.28)
RDW: 13 % (ref 12.3–15.4)
WBC: 6.2 10*3/uL (ref 3.4–10.8)

## 2018-09-16 LAB — COMPREHENSIVE METABOLIC PANEL
ALT: 15 IU/L (ref 0–32)
AST: 20 IU/L (ref 0–40)
Albumin/Globulin Ratio: 1.4 (ref 1.2–2.2)
Albumin: 4.4 g/dL (ref 3.5–5.5)
Alkaline Phosphatase: 46 IU/L (ref 39–117)
BUN/Creatinine Ratio: 15 (ref 9–23)
BUN: 10 mg/dL (ref 6–24)
Bilirubin Total: 0.4 mg/dL (ref 0.0–1.2)
CO2: 25 mmol/L (ref 20–29)
Calcium: 9.7 mg/dL (ref 8.7–10.2)
Chloride: 100 mmol/L (ref 96–106)
Creatinine, Ser: 0.68 mg/dL (ref 0.57–1.00)
GFR calc Af Amer: 120 mL/min/{1.73_m2} (ref >59)
GFR calc non Af Amer: 104 mL/min/{1.73_m2} (ref >59)
Globulin, Total: 3.2 g/dL (ref 1.5–4.5)
Glucose: 82 mg/dL (ref 65–99)
Potassium: 4.3 mmol/L (ref 3.5–5.2)
Sodium: 140 mmol/L (ref 134–144)
Total Protein: 7.6 g/dL (ref 6.0–8.5)

## 2018-09-16 LAB — LIPID PANEL
Chol/HDL Ratio: 5.8 ratio — ABNORMAL HIGH (ref 0.0–4.4)
Cholesterol, Total: 179 mg/dL (ref 100–199)
HDL: 31 mg/dL — ABNORMAL LOW (ref >39)
LDL Calculated: 86 mg/dL (ref 0–99)
Triglycerides: 311 mg/dL — ABNORMAL HIGH (ref 0–149)
VLDL Cholesterol Cal: 62 mg/dL — ABNORMAL HIGH (ref 5–40)

## 2018-09-16 LAB — TSH: TSH: 1.77 u[IU]/mL (ref 0.450–4.500)

## 2018-09-27 ENCOUNTER — Other Ambulatory Visit: Payer: Self-pay | Admitting: Family Medicine

## 2018-09-27 DIAGNOSIS — E785 Hyperlipidemia, unspecified: Secondary | ICD-10-CM

## 2018-09-27 DIAGNOSIS — E781 Pure hyperglyceridemia: Secondary | ICD-10-CM

## 2018-09-27 MED ORDER — ATORVASTATIN CALCIUM 10 MG PO TABS: 10.0000 mg | ORAL_TABLET | Freq: Every day | ORAL | 3 refills | Status: AC

## 2018-09-27 NOTE — Progress Notes (Signed)
Hypertriglyceridemia. New Rx for Lipitor sent to pharmacy today. Patient verified understanding and will pick up medication today.

## 2018-11-06 ENCOUNTER — Ambulatory Visit: Payer: Self-pay

## 2018-11-17 ENCOUNTER — Ambulatory Visit: Payer: Self-pay | Admitting: Family Medicine

## 2023-10-26 ENCOUNTER — Other Ambulatory Visit: Payer: Self-pay

## 2023-10-26 ENCOUNTER — Emergency Department (HOSPITAL_COMMUNITY)
Admission: EM | Admit: 2023-10-26 | Discharge: 2023-10-27 | Disposition: A | Discharge status: Home / Self Care | Payer: Medicaid Other | Attending: Emergency Medicine | Admitting: Emergency Medicine

## 2023-10-26 ENCOUNTER — Encounter (HOSPITAL_COMMUNITY): Payer: Self-pay

## 2023-10-26 ENCOUNTER — Emergency Department (HOSPITAL_COMMUNITY): Payer: Medicaid Other

## 2023-10-26 DIAGNOSIS — K529 Noninfective gastroenteritis and colitis, unspecified: Secondary | ICD-10-CM | POA: Insufficient documentation

## 2023-10-26 DIAGNOSIS — R109 Unspecified abdominal pain: Secondary | ICD-10-CM | POA: Diagnosis present

## 2023-10-26 LAB — URINALYSIS, ROUTINE W REFLEX MICROSCOPIC
Bacteria, UA: NONE SEEN
Bilirubin Urine: NEGATIVE
Glucose, UA: NEGATIVE mg/dL
Ketones, ur: 20 mg/dL — AB
Leukocytes,Ua: NEGATIVE
Nitrite: NEGATIVE
Protein, ur: NEGATIVE mg/dL
Specific Gravity, Urine: 1.013 (ref 1.005–1.030)
pH: 5 (ref 5.0–8.0)

## 2023-10-26 LAB — CBC WITH DIFFERENTIAL/PLATELET
Abs Immature Granulocytes: 0.02 10*3/uL (ref 0.00–0.07)
Basophils Absolute: 0 10*3/uL (ref 0.0–0.1)
Basophils Relative: 0 %
Eosinophils Absolute: 0.1 10*3/uL (ref 0.0–0.5)
Eosinophils Relative: 1 %
HCT: 38.8 % (ref 36.0–46.0)
Hemoglobin: 12.5 g/dL (ref 12.0–15.0)
Immature Granulocytes: 0 %
Lymphocytes Relative: 26 %
Lymphs Abs: 2.4 10*3/uL (ref 0.7–4.0)
MCH: 26.8 pg (ref 26.0–34.0)
MCHC: 32.2 g/dL (ref 30.0–36.0)
MCV: 83.3 fL (ref 80.0–100.0)
Monocytes Absolute: 0.8 10*3/uL (ref 0.1–1.0)
Monocytes Relative: 9 %
Neutro Abs: 6 10*3/uL (ref 1.7–7.7)
Neutrophils Relative %: 64 %
Platelets: 217 10*3/uL (ref 150–400)
RBC: 4.66 MIL/uL (ref 3.87–5.11)
RDW: 12.5 % (ref 11.5–15.5)
WBC: 9.4 10*3/uL (ref 4.0–10.5)
nRBC: 0 % (ref 0.0–0.2)

## 2023-10-26 LAB — COMPREHENSIVE METABOLIC PANEL
ALT: 17 U/L (ref 0–44)
AST: 22 U/L (ref 15–41)
Albumin: 3.9 g/dL (ref 3.5–5.0)
Alkaline Phosphatase: 55 U/L (ref 38–126)
Anion gap: 16 — ABNORMAL HIGH (ref 5–15)
BUN: 10 mg/dL (ref 6–20)
CO2: 25 mmol/L (ref 22–32)
Calcium: 10.1 mg/dL (ref 8.9–10.3)
Chloride: 104 mmol/L (ref 98–111)
Creatinine, Ser: 0.8 mg/dL (ref 0.44–1.00)
GFR, Estimated: >60 mL/min (ref >60)
Glucose, Bld: 84 mg/dL (ref 70–99)
Potassium: 3.7 mmol/L (ref 3.5–5.1)
Sodium: 145 mmol/L (ref 135–145)
Total Bilirubin: 0.7 mg/dL (ref <1.2)
Total Protein: 7.7 g/dL (ref 6.5–8.1)

## 2023-10-26 LAB — LIPASE, BLOOD: Lipase: 26 U/L (ref 11–51)

## 2023-10-26 LAB — HCG, SERUM, QUALITATIVE: Preg, Serum: NEGATIVE

## 2023-10-26 MED ORDER — IOHEXOL 350 MG/ML SOLN
75.0000 mL | Freq: Once | INTRAVENOUS | Status: AC | PRN
  Administered 2023-10-26: 75 mL via INTRAVENOUS

## 2023-10-26 NOTE — ED Triage Notes (Addendum)
Pt came to ED for medial abd pain that started yesterday. N/v. Axox4. Pt having bloody stools.

## 2023-10-26 NOTE — ED Provider Triage Note (Signed)
Emergency Medicine Provider Triage Evaluation Note  Emyia Kemper , a 52 y.o. female  was evaluated in triage.  Pt complains of abd pain. Endorse diffused abd pain with bloody stool and nausea since yesterday.  No cp, sob, dysuria  Review of Systems  Positive: As above Negative: As above  Physical Exam  BP 129/78   Pulse 82   Temp 97.7 F (36.5 C)   Resp 16   Ht 5\' 3"  (1.6 m)   Wt 54.4 kg   SpO2 100%   BMI 21.26 kg/m  Gen:   Awake, no distress   Resp:  Normal effort  MSK:   Moves extremities without difficulty  Other:    Medical Decision Making  Medically screening exam initiated at 1:47 PM.  Appropriate orders placed.  Cherl Balliet was informed that the remainder of the evaluation will be completed by another provider, this initial triage assessment does not replace that evaluation, and the importance of remaining in the ED until their evaluation is complete.     Fayrene Helper, PA-C 10/26/23 1347

## 2023-10-27 MED ORDER — DOCUSATE SODIUM 100 MG PO CAPS
100.0000 mg | ORAL_CAPSULE | Freq: Once | ORAL | Status: AC
  Administered 2023-10-27: 100 mg via ORAL

## 2023-10-27 MED ORDER — DOCUSATE SODIUM 100 MG PO CAPS: 100.0000 mg | ORAL_CAPSULE | Freq: Two times a day (BID) | ORAL | 0 refills | Status: AC

## 2023-10-27 MED ORDER — AMOXICILLIN-POT CLAVULANATE 875-125 MG PO TABS: 1.0000 | ORAL_TABLET | Freq: Two times a day (BID) | ORAL | 0 refills | Status: AC

## 2023-10-27 MED ORDER — AMOXICILLIN-POT CLAVULANATE 875-125 MG PO TABS
1.0000 | ORAL_TABLET | Freq: Once | ORAL | Status: AC
  Administered 2023-10-27: 1 via ORAL

## 2023-10-28 NOTE — ED Provider Notes (Signed)
[place] EMERGENCY DEPARTMENT AT [facility] Provider Note   CSN: [id number] Arrival date & time: 10/26/23  1229     History  Chief Complaint  Patient presents with   Abdominal Pain    Kim Washington is a 52 y.o. female.  Could not get her dialect of interpreter and patient ok using her friend that brought her for interpretation.  Has had a few days of not feeling well but then started having bloody stools today.  Not a significant amount.  No lightheadedness, palpitations, fatigue or shortness of breath.  No history of the same.  No sick contact.  No known medical history related to GI symptoms.     Abdominal Pain      Home Medications Prior to Admission medications   Medication Sig Start Date End Date Taking? Authorizing Provider  amoxicillin-clavulanate (AUGMENTIN) 875-125 MG tablet Take 1 tablet by mouth every 12 (twelve) hours. 10/27/23  Yes Susi Goslin, Barbara Cower, MD  docusate sodium (COLACE) 100 MG capsule Take 1 capsule (100 mg total) by mouth every 12 (twelve) hours. 10/27/23  Yes Prentis Langdon, Barbara Cower, MD  atorvastatin (LIPITOR) 10 MG tablet Take 1 tablet (10 mg total) by mouth daily. 09/27/18 01/25/19  Kallie Locks, FNP  meloxicam (MOBIC) 15 MG tablet Take 1 tablet (15 mg total) by mouth daily. 09/15/18   Kallie Locks, FNP  oxyCODONE-acetaminophen (PERCOCET/ROXICET) 5-325 MG tablet Take 1 tablet by mouth every 4 (four) hours as needed. Patient not taking: Reported on 11/19/2017 10/06/16   Garlon Hatchet, PA-C  pantoprazole (PROTONIX) 40 MG tablet Take 1 tablet (40 mg total) by mouth daily. 09/15/18   Kallie Locks, FNP      Allergies    Patient has no known allergies.    Review of Systems   Review of Systems  Gastrointestinal:  Positive for abdominal pain.    Physical Exam Updated Vital Signs BP 137/82 (BP Location: Right Arm)   Pulse 91   Temp 97.9 F (36.6 C) (Oral)   Resp 18   Ht 5\' 3"  (1.6 m)   Wt 54.4 kg   SpO2 99%   BMI 21.26 kg/m   Physical Exam Vitals and nursing note reviewed.  Constitutional:      Appearance: She is well-developed.  HENT:     Head: Normocephalic and atraumatic.  Cardiovascular:     Rate and Rhythm: Normal rate and regular rhythm.  Pulmonary:     Effort: No respiratory distress.     Breath sounds: No stridor.  Abdominal:     General: There is no distension.     Tenderness: There is no abdominal tenderness.  Musculoskeletal:     Cervical back: Normal range of motion.  Neurological:     Mental Status: She is alert.     ED Results / Procedures / Treatments   Labs (all labs ordered are listed, but only abnormal results are displayed) Labs Reviewed  COMPREHENSIVE METABOLIC PANEL - Abnormal; Notable for the following components:      Result Value   Anion gap 16 (*)    All other components within normal limits  URINALYSIS, ROUTINE W REFLEX MICROSCOPIC - Abnormal; Notable for the following components:   APPearance HAZY (*)    Hgb urine dipstick MODERATE (*)    Ketones, ur 20 (*)    All other components within normal limits  CBC WITH DIFFERENTIAL/PLATELET  LIPASE, BLOOD  HCG, SERUM, QUALITATIVE    EKG None  Radiology CT ABDOMEN PELVIS W CONTRAST Result  Date: 10/26/2023 CLINICAL DATA:  Acute abdominal pain nonlocalized EXAM: CT ABDOMEN AND PELVIS WITH CONTRAST TECHNIQUE: Multidetector CT imaging of the abdomen and pelvis was performed using the standard protocol following bolus administration of intravenous contrast. RADIATION DOSE REDUCTION: This exam was performed according to the departmental dose-optimization program which includes automated exposure control, adjustment of the mA and/or kV according to patient size and/or use of iterative reconstruction technique. CONTRAST:  75mL OMNIPAQUE IOHEXOL 350 MG/ML SOLN COMPARISON:  None Available. FINDINGS: Lower chest: Breathing motion along bases. Trace right pleural effusion. Hepatobiliary: No focal liver abnormality is seen. No  gallstones, gallbladder wall thickening, or biliary dilatation. Patent portal vein. Pancreas: Unremarkable. No pancreatic ductal dilatation or surrounding inflammatory changes. Spleen: Normal in size without focal abnormality. Adrenals/Urinary Tract: Adrenal glands are unremarkable. Kidneys are normal, without renal calculi, focal lesion, or hydronephrosis. Bladder is unremarkable. Stomach/Bowel: No oral contrast. The left-sided colon has wall thickening and stranding particularly along the descending colon consistent with a area of colitis. More proximally the colon is relatively decompressed as well. Ascending colon stool identified. Normal appendix. Stomach and small bowel are nondilated. Vascular/Lymphatic: No significant vascular findings are present. No enlarged abdominal or pelvic lymph nodes. Prominent periuterine vessels on the left. Reproductive: Uterus and bilateral adnexa are unremarkable. Tubal ligation clips are seen, 1 near the ascending colon in the other in the left upper hemipelvis. Other: No free air or free fluid. Breathing motion throughout the examination. Musculoskeletal: Scattered degenerative changes identified particularly along the lower lumbar spine with stenosis including at L4-5. IMPRESSION: Left-sided colonic wall thickening with stranding. Mild colitis. No obstruction or free air. Tubal ligation clips are identified away from the uterine margins. Breathing motion. Trace right pleural effusion Electronically Signed   By: Karen Kays M.D.   On: 10/26/2023 18:25    Procedures Procedures    Medications Ordered in ED Medications  iohexol (OMNIPAQUE) 350 MG/ML injection 75 mL (75 mLs Intravenous Contrast Given 10/26/23 1721)  docusate sodium (COLACE) capsule 100 mg (100 mg Oral Given 10/27/23 0132)  amoxicillin-clavulanate (AUGMENTIN) 875-125 MG per tablet 1 tablet (1 tablet Oral Given 10/27/23 0132)    ED Course/ Medical Decision Making/ A&P                                  Medical Decision Making Risk OTC drugs. Prescription drug management.   Patient's labs reassuring.  No evidence of acute hemorrhagic anemia.  Her abdomen is benign.  CT scan shows colitis is the cause for bleeding.  No blood thinners.  Will start Augmentin, stool softeners and advised on a healthy diet via interpreter.  Otherwise patient stable for discharge at this time.  No indication for admission or further workup at this time.   Final Clinical Impression(s) / ED Diagnoses Final diagnoses:  Colitis    Rx / DC Orders ED Discharge Orders          Ordered    amoxicillin-clavulanate (AUGMENTIN) 875-125 MG tablet  Every 12 hours        10/27/23 0255    docusate sodium (COLACE) 100 MG capsule  Every 12 hours        10/27/23 0255              Azarel Banner, Barbara Cower, MD 10/28/23 0401
